# Patient Record
Sex: Female | Born: 1955 | Race: White | Hispanic: No | Marital: Married | Smoking: Never smoker
Health system: Southern US, Community
[De-identification: ages and names within clinical notes are randomized; demographics above are authoritative.]

## PROBLEM LIST (undated history)

## (undated) DIAGNOSIS — M810 Age-related osteoporosis without current pathological fracture: Secondary | ICD-10-CM

## (undated) DIAGNOSIS — F32A Depression, unspecified: Secondary | ICD-10-CM

## (undated) HISTORY — DX: Depression, unspecified: F32.A

## (undated) HISTORY — DX: Age-related osteoporosis without current pathological fracture: M81.0

---

## 2005-02-21 ENCOUNTER — Ambulatory Visit: Payer: Self-pay

## 2006-03-13 ENCOUNTER — Ambulatory Visit: Payer: Self-pay

## 2007-03-31 ENCOUNTER — Ambulatory Visit: Payer: Self-pay

## 2008-04-01 ENCOUNTER — Ambulatory Visit: Payer: Self-pay

## 2009-06-15 ENCOUNTER — Ambulatory Visit: Payer: Self-pay

## 2010-05-12 ENCOUNTER — Ambulatory Visit: Payer: Self-pay | Admitting: Gastroenterology

## 2010-05-12 HISTORY — PX: COLONOSCOPY: SHX174

## 2010-07-27 ENCOUNTER — Ambulatory Visit: Payer: Self-pay

## 2014-07-29 DIAGNOSIS — M818 Other osteoporosis without current pathological fracture: Secondary | ICD-10-CM | POA: Insufficient documentation

## 2015-04-25 DIAGNOSIS — Z85828 Personal history of other malignant neoplasm of skin: Secondary | ICD-10-CM

## 2015-04-25 HISTORY — DX: Personal history of other malignant neoplasm of skin: Z85.828

## 2016-05-31 DIAGNOSIS — Z8619 Personal history of other infectious and parasitic diseases: Secondary | ICD-10-CM | POA: Insufficient documentation

## 2016-08-16 ENCOUNTER — Other Ambulatory Visit: Payer: Self-pay | Admitting: Obstetrics and Gynecology

## 2016-08-16 DIAGNOSIS — Z1231 Encounter for screening mammogram for malignant neoplasm of breast: Secondary | ICD-10-CM

## 2016-09-13 ENCOUNTER — Encounter (HOSPITAL_COMMUNITY): Payer: Self-pay

## 2016-09-13 ENCOUNTER — Ambulatory Visit
Admission: RE | Admit: 2016-09-13 | Discharge: 2016-09-13 | Disposition: A | Discharge status: Home / Self Care | Payer: PRIVATE HEALTH INSURANCE | Source: Ambulatory Visit | Attending: Obstetrics and Gynecology | Admitting: Obstetrics and Gynecology

## 2016-09-13 DIAGNOSIS — Z1231 Encounter for screening mammogram for malignant neoplasm of breast: Secondary | ICD-10-CM | POA: Insufficient documentation

## 2016-09-20 ENCOUNTER — Other Ambulatory Visit: Payer: Self-pay | Admitting: *Deleted

## 2016-09-20 ENCOUNTER — Inpatient Hospital Stay
Admission: RE | Admit: 2016-09-20 | Discharge: 2016-09-20 | Disposition: A | Discharge status: Home / Self Care | Payer: Self-pay | Source: Ambulatory Visit | Attending: *Deleted | Admitting: *Deleted

## 2016-09-20 DIAGNOSIS — Z9289 Personal history of other medical treatment: Secondary | ICD-10-CM

## 2017-08-20 ENCOUNTER — Other Ambulatory Visit: Payer: Self-pay | Admitting: Obstetrics and Gynecology

## 2017-08-20 DIAGNOSIS — Z1231 Encounter for screening mammogram for malignant neoplasm of breast: Secondary | ICD-10-CM

## 2017-09-16 ENCOUNTER — Ambulatory Visit
Admission: RE | Admit: 2017-09-16 | Discharge: 2017-09-16 | Disposition: A | Discharge status: Home / Self Care | Payer: PRIVATE HEALTH INSURANCE | Source: Ambulatory Visit | Attending: Obstetrics and Gynecology | Admitting: Obstetrics and Gynecology

## 2017-09-16 DIAGNOSIS — Z1231 Encounter for screening mammogram for malignant neoplasm of breast: Secondary | ICD-10-CM | POA: Diagnosis not present

## 2017-11-06 ENCOUNTER — Ambulatory Visit
Admission: RE | Admit: 2017-11-06 | Discharge: 2017-11-06 | Disposition: A | Discharge status: Home / Self Care | Payer: PRIVATE HEALTH INSURANCE | Source: Ambulatory Visit | Attending: Internal Medicine | Admitting: Internal Medicine

## 2017-11-06 ENCOUNTER — Other Ambulatory Visit: Payer: Self-pay | Admitting: Internal Medicine

## 2017-11-06 DIAGNOSIS — M419 Scoliosis, unspecified: Secondary | ICD-10-CM | POA: Diagnosis not present

## 2017-11-06 DIAGNOSIS — K3580 Unspecified acute appendicitis: Secondary | ICD-10-CM | POA: Insufficient documentation

## 2017-11-06 MED ORDER — IOPAMIDOL (ISOVUE-370) INJECTION 76%
75.0000 mL | Freq: Once | INTRAVENOUS | Status: AC | PRN
  Administered 2017-11-06: 75 mL via INTRAVENOUS

## 2018-03-05 ENCOUNTER — Ambulatory Visit: Payer: Self-pay | Admitting: Podiatry

## 2018-11-24 ENCOUNTER — Other Ambulatory Visit: Payer: Self-pay

## 2018-11-24 ENCOUNTER — Ambulatory Visit: Payer: PRIVATE HEALTH INSURANCE | Attending: Obstetrics and Gynecology

## 2018-11-24 DIAGNOSIS — R293 Abnormal posture: Secondary | ICD-10-CM | POA: Insufficient documentation

## 2018-11-24 DIAGNOSIS — M62838 Other muscle spasm: Secondary | ICD-10-CM | POA: Diagnosis not present

## 2018-11-24 NOTE — Therapy (Deleted)
[place] MAIN [facility] SERVICES [address] [place], [place], [address] Phone: [phone]   Fax:  [phone]  Physical Therapy Evaluation  Patient Details  Name: Anna [name] MRN: [id number] Date of Birth: 08-24-1955 No data recorded  Encounter Date: 12/08/2018    Past Medical History:  Diagnosis Date  . Hx of squamous cell carcinoma of skin 04/25/2015   L chest parasternal    No past surgical history on file.  There were no vitals filed for this visit.    Pelvic Floor Physical Therapy Evaluation and Assessment  SCREENING  Falls in last 6 mo: no    Patient's communication preference:   Red Flags:  Have you had any night sweats? no Unexplained weight loss? No  Saddle anesthesia? no Unexplained changes in bowel or bladder habits? no  SUBJECTIVE  Patient reports: Pain increased ~ 2 years ago around the same time that she was diagnosed with lichen sclerosis. Has had a month where she did not have pain but had pain the most recent time. Wads going to the gym 3 times per week for personal training but was not able to due to covid-19 is still walking.  Precautions:  Hx. Of skin CA.  Social/Family/Vocational History:   Retired Pharmacist, hospital  Recent Procedures/Tests/Findings:  Lichen sclerosis  Obstetrical History: 2 vaginal with episiotomy (26 stitches)  Gynecological History: none  Urinary History: none  Gastrointestinal History: BM ~ daily with no straining in the last ~ 2 years  Sexual activity/pain: Yes both initial and with deep thrusting. Has "stinging" after  Location of pain: vaginal  Current pain:  0/10  Max pain:  6/10 Least pain:  0/10 Nature of pain: stinging, sharp  Patient Goals: To be able to have sex without being in pain.   OBJECTIVE  Posture/Observations:  Sitting: slouched Standing: L ASIS low, LLE ER, slight posterior pelvic tilt, L PSIS low. Decreased motion to L SB compared to R. L  rotation ~ 25% decreased with discomfort at ER. Supine: R ASIS high Prone: R PSIS high  Palpation/Segmental Motion/Joint Play: Decreased mobility through sacrum and T-L junction. R adductors and Psoas TTP  Special tests:   Supine to long-sit: R long in both Forward bend: apparent R lumbar curve (rotation of spine) Leg-length: 88.5 B  Range of Motion/Flexibilty:  Spine: Hips:   Strength/MMT:  LE MMT  LE MMT Left Right  Hip flex:  (L2) /5 /5  Hip ext: /5 /5  Hip abd: /5 /5  Hip add: /5 /5  Hip IR /5 /5  Hip ER /5 /5     Abdominal:  Palpation: R psoas TTP Diastasis: none  Pelvic Floor External Exam: Introitus Appears:  Skin integrity:  Palpation: Cough: Prolapse visible?: Scar mobility:  Internal Vaginal Exam: Strength (PERF):  Symmetry: Palpation: Prolapse:   Internal Rectal Exam: Strength (PERF): Symmetry: Palpation: Prolapse:   Gait Analysis:   Pelvic Floor Outcome Measures:  VQ: 4/33 (12%), Female NIH-CPSI: 13/43 (30%)  INTERVENTIONS THIS SESSION:   Total time:                Objective measurements completed on examination: See above findings.                             Patient will benefit from skilled therapeutic intervention in order to improve the following deficits and impairments:     Visit Diagnosis: No diagnosis found.     Problem  List There are no active problems to display for this patient.   Willa Rough 11/24/2018, 10:08 AM  [place] MAIN [facility] SERVICES [address] [place], [place], [address] Phone: [phone]   Fax:  [phone]  Name: Anna [name] MRN: [id number] Date of Birth: March 17, 1956

## 2018-11-24 NOTE — Therapy (Signed)
[place] MAIN [facility] SERVICES [address] [place], [place], [address] Phone: [phone]   Fax:  [phone]  Physical Therapy Evaluation  The patient has been informed of current processes in place at Outpatient Rehab to protect patients from Covid-19 exposure including social distancing, schedule modifications, and new cleaning procedures. After discussing their particular risk with a therapist based on the patient's personal risk factors, the patient has decided to proceed with in-person therapy.   Patient Details  Name: Anna [name] MRN: [id number] Date of Birth: 1955/09/25 Referring Provider (PT): Benjaman Kindler   Encounter Date: 11/24/2018    Past Medical History:  Diagnosis Date  . Hx of squamous cell carcinoma of skin 04/25/2015   L chest parasternal    History reviewed. No pertinent surgical history.  There were no vitals filed for this visit.       Pelvic Floor Physical Therapy Evaluation and Assessment  SCREENING  Falls in last 6 mo: no    Patient's communication preference:   Red Flags:  Have you had any night sweats? no Unexplained weight loss? No  Saddle anesthesia? no Unexplained changes in bowel or bladder habits? no  SUBJECTIVE  Patient reports: Pain increased ~ 2 years ago around the same time that she was diagnosed with lichen sclerosis. Has had a month where she did not have pain but had pain the most recent time. Wads going to the gym 3 times per week for personal training but was not able to due to covid-19 is still walking.  Precautions:  Hx. Of skin CA.  Social/Family/Vocational History:   Retired Pharmacist, hospital  Recent Procedures/Tests/Findings:  Lichen sclerosis  Obstetrical History: 2 vaginal with episiotomy (26 stitches)  Gynecological History: none  Urinary History: none  Gastrointestinal History: BM ~ daily with no straining in the last ~ 2 years  Sexual activity/pain: Yes  both initial and with deep thrusting. Has "stinging" after  Location of pain: vaginal  Current pain:  0/10  Max pain:  6/10 Least pain:  0/10 Nature of pain: stinging, sharp  Patient Goals: To be able to have sex without being in pain.   OBJECTIVE  Posture/Observations:  Sitting: slouched Standing: L ASIS low, LLE ER, slight posterior pelvic tilt, L PSIS low.  Supine: R ASIS high Prone: R PSIS high  Palpation/Segmental Motion/Joint Play: Decreased mobility through sacrum and T-L junction. R adductors and Psoas TTP  Special tests:   Supine to long-sit: R long in both Forward bend: apparent R lumbar curve (rotation of spine-R lumbar facets prominent) Leg-length: 88.5 B (may   Range of Motion/Flexibilty:  Spine: L SB 3 fingers past knee, R SB to knee. L rotation ~ 25% decreased with discomfort at ER.  Hips: deferred to follow-up   Strength/MMT: deferred to follow up LE MMT  LE MMT Left Right  Hip flex:  (L2) /5 /5  Hip ext: /5 /5  Hip abd: /5 /5  Hip add: /5 /5  Hip IR /5 /5  Hip ER /5 /5     Abdominal:  Palpation: R psoas TTP Diastasis: none  Pelvic Floor External Exam: Deferred due to covid-19 precautions Introitus Appears:  Skin integrity:  Palpation: Cough: Prolapse visible?: Scar mobility:  Internal Vaginal Exam: Strength (PERF):  Symmetry: Palpation: Prolapse:   Internal Rectal Exam: Strength (PERF): Symmetry: Palpation: Prolapse:   Gait Analysis: deferred to follow-up   Pelvic Floor Outcome Measures:  VQ: 4/33 (12%), Female NIH-CPSI: 13/43 (30%)  INTERVENTIONS THIS SESSION: Self-care:Educated on  the structure and function of the pelvic floor in relation to their symptoms as well as the POC, and initial HEP in order to set patient expectations and understanding from which we will build on in the future sessions.  Therex: Educated on and practiced bow-and-arrow and side-stretch to decrease impact of scoliosis by improving spinal  mobility and alignment to decrease pressure on nerve roots.  Total time: 60 min.  [facility] Building PT Assessment - 11/24/18 1338      Assessment   Medical Diagnosis  Vaginismus/dyspareunia    Referring Provider (PT)  Benjaman Kindler    Onset Date/Surgical Date  11/23/16    Prior Therapy  none      Precautions   Precautions  None      Restrictions   Weight Bearing Restrictions  No      Home Environment   Living Environment  Private residence    Living Arrangements  Spouse/significant other    Available Help at Discharge  Family    Type of [place] to enter    Entrance Stairs-Number of Steps  2    Entrance Stairs-Rails  None    Home Layout  One level      Prior Function   Level of [place]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                PT Short Term Goals - 11/24/18 1442      PT SHORT TERM GOAL #1   Title  Patient will demonstrate improved pelvic alignment and balance of musculature surrounding the pelvis to facilitate decreased PFM spasms and decrease pelvic pain.    Baseline  Pt. Demos spasms in R adductor and Psoas, R innominate up-slipped     Time  5    Period  Weeks    Status  New    Target Date  12/29/18      PT SHORT TERM GOAL #2   Title  Patient will demonstrate HEP x1 in the clinic to demonstrate understanding and proper form to allow for further improvement.    Baseline  Pt. lacks knowledge of therapeutic exercises that will decrease her pain.    Time  5    Period  Weeks    Status  New    Target Date  12/29/18      PT SHORT TERM GOAL #3   Title  Patient will report a reduction in pain to no greater than 3/10 over the prior week to  demonstrate symptom improvement.    Baseline  6/10 with intercourse    Time  5    Period  Weeks    Status  New    Target Date  12/29/18        PT Long Term Goals - 11/24/18 1502      PT LONG TERM GOAL #1   Title  Patient will report no pain with intercourse to demonstrate improved functional ability.    Baseline  Pt. is in 6/10 pain with intercourse.    Time  10    Period  Weeks  Status  New    Target Date  02/02/19      PT LONG TERM GOAL #2   Title  Patient will score less than or equal to 15% on the Female NIH-CPSI and 6% on the VQ to demonstrate a reduction in pain, urinary symptoms, and an improved quality of life.    Baseline  VQ: 4/33 (12%), Female NIH-CPSI: 13/43 (30%)    Time  10    Period  Weeks    Target Date  02/02/19      PT LONG TERM GOAL #3   Title  Patient will demonstrate appropriate body mechanics with bending and lifting heavy objects to allow for decreased stress on the pelvic floor and low back.     Baseline  Pt demos genu valgus with BW squat, contributing to adductor spasms and PFM spasms.    Time  10    Period  Weeks    Status  New    Target Date  02/02/19             Plan - 11/24/18 1345    Clinical Impression Statement  Pt. is a 63 y/o female who presents today with cheif c/o pain with intercourse starting ~ 2 years prior. Her relevant PMH includes lichen sclerosis and 2 vaginal deliveries with significant episiotomies. Her clinical assessment revealed mild scoliosis and a true vs. apparent leg-length difference as well as significant spasm in the R adductors and psoas which are likely contributing, along with the lichen sclerosis, to vaginismus and pain with intercourse. She will benefit from skilled pelvic PT to address the noted defecits and to continue to assess for and address other potential causes of pain.    Personal Factors and Comorbidities  Comorbidity 1    Comorbidities  Lichen Sclerosis    Examination-Activity Limitations  Other    intercourse   Examination-Participation Restrictions  Interpersonal Relationship    Stability/Clinical Decision Making  Evolving/Moderate complexity    Clinical Decision Making  Moderate    Rehab Potential  Good    PT Frequency  1x / week    PT Duration  --   10 weeks   PT Treatment/Interventions  ADLs/Self Care Home Management;Biofeedback;Electrical Stimulation;Traction;Moist Heat;Functional mobility training;Neuromuscular re-education;Therapeutic exercise;Therapeutic activities;Patient/family education;Manual techniques;Dry needling;Passive range of motion;Scar mobilization;Spinal Manipulations;Joint Manipulations    PT Next Visit Plan  TP release vs. DN to R adductors and Psoas, pelvic allignment, re-assess leg-length following alignment, posture education.    PT Home Exercise Plan  bow-and-arrow, side-stretch.    Consulted and Agree with Plan of Care  Patient       Patient will benefit from skilled therapeutic intervention in order to improve the following deficits and impairments:  Pain, Postural dysfunction, Increased muscle spasms, Decreased scar mobility, Decreased mobility, Decreased coordination, Decreased range of motion  Visit Diagnosis: Other muscle spasm  Abnormal posture     Problem List There are no active problems to display for this patient.  Willa Rough DPT, ATC Willa Rough 11/24/2018, 3:06 PM  [place] MAIN [facility] SERVICES [address] [place], [place], [address] Phone: [phone]   Fax:  [phone]  Name: AARILYN DYE MRN: [id number] Date of Birth: May 22, 1956

## 2018-11-24 NOTE — Patient Instructions (Signed)
   Hold for 30 seconds (5 deep breaths) and repeat 2-3 times on each side once a day

## 2018-11-24 NOTE — Therapy (Signed)
[place] MAIN [facility] SERVICES [address] [place], [place], [address] Phone: [phone]   Fax:  [phone]  Patient Details  Name: Anna [name] MRN: [id number] Date of Birth: [birth year] Referring Provider:  Rusty Aus, MD  Encounter Date: 12/08/2018  This encounter was an erroneous error for a future DOS.  Willa Rough DPT, ATC Willa Rough 11/24/2018, 1:41 PM  [place] MAIN [facility] SERVICES [address] [place], [place], [address] Phone: [phone]   Fax:  [phone]

## 2018-12-02 ENCOUNTER — Ambulatory Visit: Payer: PRIVATE HEALTH INSURANCE

## 2018-12-02 ENCOUNTER — Other Ambulatory Visit: Payer: Self-pay

## 2018-12-02 ENCOUNTER — Other Ambulatory Visit: Payer: Self-pay | Admitting: Obstetrics and Gynecology

## 2018-12-02 DIAGNOSIS — R293 Abnormal posture: Secondary | ICD-10-CM

## 2018-12-02 DIAGNOSIS — M62838 Other muscle spasm: Secondary | ICD-10-CM

## 2018-12-02 DIAGNOSIS — Z1231 Encounter for screening mammogram for malignant neoplasm of breast: Secondary | ICD-10-CM

## 2018-12-02 NOTE — Patient Instructions (Signed)
     Find a tender spot and hold pressure with hand, ball, theracane etc. And take deep diaphragmatic breaths until the pain is reduced by at least 50%.  Stabilization: Diaphragmatic Breathing    Lie with knees bent, feet flat. Place one hand on stomach, other on chest. Breathe deeply through nose, lifting belly hand without any motion of hand on chest.

## 2018-12-02 NOTE — Therapy (Signed)
[place] MAIN [facility] SERVICES [address] [place], [place], [address] Phone: [phone]   Fax:  [phone]  Physical Therapy Treatment  The patient has been informed of current processes in place at Outpatient Rehab to protect patients from Covid-19 exposure including social distancing, schedule modifications, and new cleaning procedures. After discussing their particular risk with a therapist based on the patient's personal risk factors, the patient has decided to proceed with in-person therapy.   Patient Details  Name: Anna [name] MRN: [id number] Date of Birth: May 03, 1956 Referring Provider (PT): Benjaman Kindler   Encounter Date: 12/02/2018  PT End of Session - 12/04/18 1014    Visit Number  2    Number of Visits  10    Date for PT Re-Evaluation  02/02/19    Authorization Type  medcost    Authorization Time Period  through 02/02/2019    Authorization - Visit Number  2    Authorization - Number of Visits  10    PT Start Time  1000    PT Stop Time  1100    PT Time Calculation (min)  60 min    Activity Tolerance  Patient tolerated treatment well;No increased pain    Behavior During Therapy  WFL for tasks assessed/performed       Past Medical History:  Diagnosis Date  . Hx of squamous cell carcinoma of skin 04/25/2015   L chest parasternal    No past surgical history on file.  There were no vitals filed for this visit.    Pelvic Floor Physical Therapy Treatment Note  SCREENING  Changes in medications, allergies, or medical history?: none    SUBJECTIVE  Patient reports:  Feels good about the exercises, but her R LE seems to be worse, pain into R foot.  Precautions:  Hx. Of skin CA.  Pain update:  Location of pain:  Current pain:  3/10  Max pain:  4/10 Least pain:  2/10 Nature of pain: stinging, sharp  * just "sore" from pressure not normal pain following treatment.  Patient Goals: To be able to have sex  without being in pain.   OBJECTIVE  Changes in: Posture/Observations:  RLE appears long and up-slipped  Range of Motion/Flexibilty:  Improved thoracic rotation compared to evaluation due to use of HEP.  Palpation: TTP to R adductors, iliacus, and QL  INTERVENTIONS THIS SESSION: Manual: Performed TP release and STM to R adductors, iliacus, and QL to decrease spasms and allow for decreased pain and improved pelvic alignment.  Theract: Educated on how to perform self TP release at home with tennis ball or theracane to continue to decrease spasms and pain due to Pt. Slow response. Educated on and practiced diaphragmatic breathing to allow for successful TP release and to improve coordination of deep core musculature to allow for decreased PFM spasm and pain.  Total time: 60 min.                   Trigger Point Dry Needling - 12/04/18 0001    Consent Given?  --             PT Short Term Goals - 11/24/18 1442      PT SHORT TERM GOAL #1   Title  Patient will demonstrate improved pelvic alignment and balance of musculature surrounding the pelvis to facilitate decreased PFM spasms and decrease pelvic pain.    Baseline  Pt. Demos spasms in R adductor and Psoas, R innominate up-slipped  Time  5    Period  Weeks    Status  New    Target Date  12/29/18      PT SHORT TERM GOAL #2   Title  Patient will demonstrate HEP x1 in the clinic to demonstrate understanding and proper form to allow for further improvement.    Baseline  Pt. lacks knowledge of therapeutic exercises that will decrease her pain.    Time  5    Period  Weeks    Status  New    Target Date  12/29/18      PT SHORT TERM GOAL #3   Title  Patient will report a reduction in pain to no greater than 3/10 over the prior week to demonstrate symptom improvement.    Baseline  6/10 with intercourse    Time  5    Period  Weeks    Status  New    Target Date  12/29/18        PT Long Term Goals -  11/24/18 1502      PT LONG TERM GOAL #1   Title  Patient will report no pain with intercourse to demonstrate improved functional ability.    Baseline  Pt. is in 6/10 pain with intercourse.    Time  10    Period  Weeks    Status  New    Target Date  02/02/19      PT LONG TERM GOAL #2   Title  Patient will score less than or equal to 15% on the Female NIH-CPSI and 6% on the VQ to demonstrate a reduction in pain, urinary symptoms, and an improved quality of life.    Baseline  VQ: 4/33 (12%), Female NIH-CPSI: 13/43 (30%)    Time  10    Period  Weeks    Target Date  02/02/19      PT LONG TERM GOAL #3   Title  Patient will demonstrate appropriate body mechanics with bending and lifting heavy objects to allow for decreased stress on the pelvic floor and low back.     Baseline  Pt demos genu valgus with BW squat, contributing to adductor spasms and PFM spasms.    Time  10    Period  Weeks    Status  New    Target Date  02/02/19            Plan - 12/04/18 1016    Clinical Impression Statement  Pt. responded well to all interventions today though she responds slowly to TP release and will benefit from DN in the future. She demonstrated decreased tensing ond painas well as understanding and correct performance of all eduction and exercises provided. Heel-lift to be given at following appointment due to Pt's continued stiffness increasing likelihood of increased pain with heel-lift if given at this visit. Continue per POC.    Personal Factors and Comorbidities  Comorbidity 1    Comorbidities  Lichen Sclerosis    Examination-Activity Limitations  Other   intercourse   Examination-Participation Restrictions  Interpersonal Relationship    Stability/Clinical Decision Making  Evolving/Moderate complexity    Rehab Potential  Good    PT Frequency  1x / week    PT Duration  --   10 weeks   PT Treatment/Interventions  ADLs/Self Care Home Management;Biofeedback;Electrical  Stimulation;Traction;Moist Heat;Functional mobility training;Neuromuscular re-education;Therapeutic exercise;Therapeutic activities;Patient/family education;Manual techniques;Dry needling;Passive range of motion;Scar mobilization;Spinal Manipulations;Joint Manipulations    PT Next Visit Plan  DN to R Psoas, LB PRN  re-assess pelvic allignment, re-assess leg-length following alignment, posture education.    PT Home Exercise Plan  bow-and-arrow, side-stretch, self TP release and diaphragmatic breathing.    Consulted and Agree with Plan of Care  Patient       Patient will benefit from skilled therapeutic intervention in order to improve the following deficits and impairments:  Pain, Postural dysfunction, Increased muscle spasms, Decreased scar mobility, Decreased mobility, Decreased coordination, Decreased range of motion  Visit Diagnosis: Other muscle spasm -   Abnormal posture -      Problem List There are no active problems to display for this patient.  Willa Rough DPT, ATC Willa Rough 12/04/2018, 10:21 AM  [place] MAIN [facility] SERVICES [address] [place], [place], [address] Phone: [phone]   Fax:  [phone]  Name: Anna [name] MRN: [id number] Date of Birth: July 15, 1955

## 2018-12-08 ENCOUNTER — Ambulatory Visit: Payer: PRIVATE HEALTH INSURANCE

## 2018-12-15 ENCOUNTER — Other Ambulatory Visit: Payer: Self-pay

## 2018-12-15 ENCOUNTER — Ambulatory Visit: Payer: PRIVATE HEALTH INSURANCE

## 2018-12-15 DIAGNOSIS — M62838 Other muscle spasm: Secondary | ICD-10-CM | POA: Diagnosis not present

## 2018-12-15 DIAGNOSIS — R293 Abnormal posture: Secondary | ICD-10-CM

## 2018-12-15 NOTE — Patient Instructions (Signed)
    Sit up with a tall spine and cross one leg over the other knee. Hinge from the hip and lean until you can feel a stretch through your bottom hold for 5 deep breaths and then switch sides. Repeat 2-3 times on each side.

## 2018-12-15 NOTE — Therapy (Signed)
[place] MAIN [facility] SERVICES [address] [place], [place], [address] Phone: [phone]   Fax:  [phone]  Physical Therapy Treatment  The patient has been informed of current processes in place at Outpatient Rehab to protect patients from Covid-19 exposure including social distancing, schedule modifications, and new cleaning procedures. After discussing their particular risk with a therapist based on the patient's personal risk factors, the patient has decided to proceed with in-person therapy.   Patient Details  Name: Anna [name] MRN: [id number] Date of Birth: 1956-03-09 Referring Provider (PT): Benjaman Kindler   Encounter Date: 12/15/2018  PT End of Session - 12/15/18 1105    Visit Number  3    Number of Visits  10    Date for PT Re-Evaluation  02/02/19    Authorization Type  medcost    Authorization Time Period  through 02/02/2019    Authorization - Visit Number  3    Authorization - Number of Visits  10    PT Start Time  0950    PT Stop Time  2725    PT Time Calculation (min)  61 min    Activity Tolerance  Patient tolerated treatment well;No increased pain    Behavior During Therapy  WFL for tasks assessed/performed       Past Medical History:  Diagnosis Date  . Hx of squamous cell carcinoma of skin 04/25/2015   L chest parasternal    No past surgical history on file.  There were no vitals filed for this visit.    Pelvic Floor Physical Therapy Treatment Note  SCREENING  Changes in medications, allergies, or medical history?: no    SUBJECTIVE  Patient reports: Has tried having intercourse which did not go well.  Precautions:  Hx. Of skin CA.  Pain update:  Location of pain: R outer thigh Current pain:  2/10  Max pain:  4/10 Least pain:  0/10 Nature of pain: dull achy  *Pt. Reports "feels much better" but has 4/10 pain which is sore like DOMS, not like normal pain.  Patient Goals: To be able to  have sex without being in pain.   OBJECTIVE  Changes in:  Palpation: TTP to R QL, Lumbar extensors, Piriformis, and OI.    INTERVENTIONS THIS SESSION: Self-care: Educated further on dilator use and options as well as how to use HSA to help cover cost PRN.  Manual: Performed TP release and STM to R QL, Piriformis, and OI to decrease pain and pressure on lumbosacral nerve roots to allow PFM relaxation. Dry-needle: Performed TPDN  As described above to decrease spasm and pain and allow for improved balance of musculature for improved function and decreased symptoms. Therex: Educated on and practiced Piriformis stretch to decrease tension on Sciatic nerve and pain in buttock.   Total time: 61 min.                    Trigger Point Dry Needling - 12/15/18 0001    Consent Given?  Yes    Education Handout Provided  No    Muscles Treated Back/Hip  Erector spinae;Obturator internus;Quadratus lumborum    Dry Needling Comments  --   R side, used standard procedure in side-lying for all areas   Erector spinae Response  Twitch response elicited;Palpable increased muscle length    Obturator internus Response  Twitch response elicited;Palpable increased muscle length    Quadratus Lumborum Response  Twitch response elicited;Palpable increased muscle length  PT Short Term Goals - 11/24/18 1442      PT SHORT TERM GOAL #1   Title  Patient will demonstrate improved pelvic alignment and balance of musculature surrounding the pelvis to facilitate decreased PFM spasms and decrease pelvic pain.    Baseline  Pt. Demos spasms in R adductor and Psoas, R innominate up-slipped     Time  5    Period  Weeks    Status  New    Target Date  12/29/18      PT SHORT TERM GOAL #2   Title  Patient will demonstrate HEP x1 in the clinic to demonstrate understanding and proper form to allow for further improvement.    Baseline  Pt. lacks knowledge of therapeutic exercises that  will decrease her pain.    Time  5    Period  Weeks    Status  New    Target Date  12/29/18      PT SHORT TERM GOAL #3   Title  Patient will report a reduction in pain to no greater than 3/10 over the prior week to demonstrate symptom improvement.    Baseline  6/10 with intercourse    Time  5    Period  Weeks    Status  New    Target Date  12/29/18        PT Long Term Goals - 11/24/18 1502      PT LONG TERM GOAL #1   Title  Patient will report no pain with intercourse to demonstrate improved functional ability.    Baseline  Pt. is in 6/10 pain with intercourse.    Time  10    Period  Weeks    Status  New    Target Date  02/02/19      PT LONG TERM GOAL #2   Title  Patient will score less than or equal to 15% on the Female NIH-CPSI and 6% on the VQ to demonstrate a reduction in pain, urinary symptoms, and an improved quality of life.    Baseline  VQ: 4/33 (12%), Female NIH-CPSI: 13/43 (30%)    Time  10    Period  Weeks    Target Date  02/02/19      PT LONG TERM GOAL #3   Title  Patient will demonstrate appropriate body mechanics with bending and lifting heavy objects to allow for decreased stress on the pelvic floor and low back.     Baseline  Pt demos genu valgus with BW squat, contributing to adductor spasms and PFM spasms.    Time  10    Period  Weeks    Status  New    Target Date  02/02/19            Plan - 12/15/18 1106    Clinical Impression Statement  Pt. responded well to all interventions today, demonstrating improved response to combination of manual treatment and TPDN than to manual alone. Pt. continues to have Pirifomis spasm on R that will need to be treated at following session. Pt. demonstrated understanding and correct performance of all education provided. Continue per POC.    Personal Factors and Comorbidities  Comorbidity 1    Comorbidities  Lichen Sclerosis    Examination-Activity Limitations  Other   intercourse   Examination-Participation  Restrictions  Interpersonal Relationship    Stability/Clinical Decision Making  Evolving/Moderate complexity    Rehab Potential  Good    PT Frequency  1x / week  PT Duration  --   10 weeks   PT Treatment/Interventions  ADLs/Self Care Home Management;Biofeedback;Electrical Stimulation;Traction;Moist Heat;Functional mobility training;Neuromuscular re-education;Therapeutic exercise;Therapeutic activities;Patient/family education;Manual techniques;Dry needling;Passive range of motion;Scar mobilization;Spinal Manipulations;Joint Manipulations    PT Next Visit Plan  DN to R Psoas, Piriformis PRN re-assess pelvic allignment, re-assess leg-length following alignment, posture education.    PT Home Exercise Plan  bow-and-arrow, side-stretch, self TP release and diaphragmatic breathing, piriformis stretch.    Consulted and Agree with Plan of Care  Patient       Patient will benefit from skilled therapeutic intervention in order to improve the following deficits and impairments:  Pain, Postural dysfunction, Increased muscle spasms, Decreased scar mobility, Decreased mobility, Decreased coordination, Decreased range of motion  Visit Diagnosis: 1. Other muscle spasm   2. Abnormal posture        Problem List There are no active problems to display for this patient.  Willa Rough DPT, ATC Willa Rough 12/15/2018, 11:15 AM  [place] MAIN [facility] SERVICES [address] [place], [place], [address] Phone: [phone]   Fax:  [phone]  Name: Anna [name] MRN: [id number] Date of Birth: 1956/05/22

## 2018-12-16 ENCOUNTER — Other Ambulatory Visit: Payer: Self-pay

## 2018-12-16 ENCOUNTER — Ambulatory Visit
Admission: RE | Admit: 2018-12-16 | Discharge: 2018-12-16 | Disposition: A | Discharge status: Home / Self Care | Payer: PRIVATE HEALTH INSURANCE | Source: Ambulatory Visit | Attending: Obstetrics and Gynecology | Admitting: Obstetrics and Gynecology

## 2018-12-16 DIAGNOSIS — Z1231 Encounter for screening mammogram for malignant neoplasm of breast: Secondary | ICD-10-CM | POA: Insufficient documentation

## 2018-12-22 ENCOUNTER — Ambulatory Visit: Payer: PRIVATE HEALTH INSURANCE

## 2018-12-22 ENCOUNTER — Other Ambulatory Visit: Payer: Self-pay

## 2018-12-22 DIAGNOSIS — R293 Abnormal posture: Secondary | ICD-10-CM

## 2018-12-22 DIAGNOSIS — M62838 Other muscle spasm: Secondary | ICD-10-CM | POA: Diagnosis not present

## 2018-12-22 NOTE — Patient Instructions (Signed)
Use dilator with lubricant for ~ 10 min. Every-other day. Move up a size when able to insert fully with very little discomfort. Start with one-size down from your "working size"   Use your deep breathing and imagery of relaxation/flower blooming   Lubrication . Used for intercourse to reduce friction . Avoid ones that have glycerin, warming gels, tingling gels, icing or cooling gel, scented . May need to be reapplied once or several times during sexual activity . Can be applied to both partners genitals prior to vaginal penetration to minimize friction or irritation . Prevent irritation and mucosal tears that cause post coital pain and increased the risk of vaginal and urinary tract infections . Oil-based lubricants cannot be used with condoms due to breaking them down.  Least likely to irritate vaginal tissue.  . Plant based-lubes are safe . Silicone-based lubrication are thicker and last long and used for post-menopausal women Types of Lubricants . Good Clean Love (water based)-[employer], [employer], [employer], [employer] . Slippery Stuff(water based) [employer] . Sylk (water based) [employer], Cricket- drug store; www.blossom-organics.com . Samul Dada- Drug store . Coconut oil- will breakdown condoms, least irritating . Aloe Vera- least irritating . Sliquid Natural H20 (water based)-[employer], good if frequent UTI's . Wet Platinum- (Silicone) Target, [employer] . Yes [place] . [place] Jelly, Replens, and Astroglide kills good Bacteria (lactobadilli)  Things to avoid in the vaginal area . Do not use things to irritate the vulvar area . No lotions . No soaps; can use Aveeno or Calendula cleanser if needed. Must be gentle . No deodorants . No douches . Good to sleep without underwear to let the vaginal area to air out . No scrubbing: spread the lips to let warm water rinse over labias and pat dry

## 2018-12-22 NOTE — Therapy (Signed)
[place] MAIN [facility] SERVICES [address] [place], [place], [address] Phone: [phone]   Fax:  [phone]  Physical Therapy Treatment  The patient has been informed of current processes in place at Outpatient Rehab to protect patients from Covid-19 exposure including social distancing, schedule modifications, and new cleaning procedures. After discussing their particular risk with a therapist based on the patient's personal risk factors, the patient has decided to proceed with in-person therapy.   Patient Details  Name: KLARA STJAMES MRN: [id number] Date of Birth: 1956-06-05 Referring Provider (PT): Benjaman Kindler   Encounter Date: 12/22/2018  PT End of Session - 12/22/18 1642    Visit Number  4    Number of Visits  10    Date for PT Re-Evaluation  02/02/19    Authorization Type  medcost    Authorization Time Period  through 02/02/2019    Authorization - Visit Number  4    Authorization - Number of Visits  10    PT Start Time  1761    PT Stop Time  1122    PT Time Calculation (min)  60 min    Activity Tolerance  Patient tolerated treatment well;No increased pain    Behavior During Therapy  WFL for tasks assessed/performed       Past Medical History:  Diagnosis Date  . Hx of squamous cell carcinoma of skin 04/25/2015   L chest parasternal    No past surgical history on file.  There were no vitals filed for this visit.   Pelvic Floor Physical Therapy Treatment Note  SCREENING  Changes in medications, allergies, or medical history?: none    SUBJECTIVE  Patient reports: Slipped and fell in her garage on some water on the floor, her LB is hurting a little and R inner knee. Ordered her dilators (white set)  Precautions:  Hx. Of skin CA.  Pain update:  Location of pain: R knee (bruised) and R SIJ/LB Current pain:  4/10  Max pain:  4/10 Least pain:  0/10 Nature of pain: dull achy  Patient Goals: To be able to  have sex without being in pain.   OBJECTIVE  Changes in: Posture/Observations:  R [place] elevated following fall on R knee  Palpation: TTP to R QL and Lumbar Multifidus and paraspinals   INTERVENTIONS THIS SESSION: Manual: Performed TP release to R QL and Lumbar Multifidus and paraspinals to decrease pain and spasm and allow for return to normal alignment following fall to allow for further improvement of balance and allow for PFM relaxation. Dry-needle: Performed TPDN as described below using .30x60 and .30x14mm needles and standard approach To maintain and improve muscle length and allow for improved balance of musculature for long-term symptom relief. Self-care: educated on how to use dilators and vaginal lubricants to increase tissue extensibility and decrease pain with intercourse.  Total time: 60 min.    [facility] PT Assessment - 12/22/18 0001      Balance Screen   Has the patient fallen in the past 6 months  Yes   slipped on water in her garage, landed on R knee.                    Trigger Point Dry Needling - 12/22/18 0001    Consent Given?  Yes    Education Handout Provided  No    Muscles Treated Back/Hip  Erector spinae;Lumbar multifidi;Quadratus lumborum    Dry Needling Comments  Right side  Erector spinae Response  Twitch response elicited;Palpable increased muscle length    Lumbar multifidi Response  Twitch response elicited;Palpable increased muscle length    Quadratus Lumborum Response  Twitch response elicited;Palpable increased muscle length             PT Short Term Goals - 11/24/18 1442      PT SHORT TERM GOAL #1   Title  Patient will demonstrate improved pelvic alignment and balance of musculature surrounding the pelvis to facilitate decreased PFM spasms and decrease pelvic pain.    Baseline  Pt. Demos spasms in R adductor and Psoas, R innominate up-slipped     Time  5    Period  Weeks    Status  New    Target Date  12/29/18       PT SHORT TERM GOAL #2   Title  Patient will demonstrate HEP x1 in the clinic to demonstrate understanding and proper form to allow for further improvement.    Baseline  Pt. lacks knowledge of therapeutic exercises that will decrease her pain.    Time  5    Period  Weeks    Status  New    Target Date  12/29/18      PT SHORT TERM GOAL #3   Title  Patient will report a reduction in pain to no greater than 3/10 over the prior week to demonstrate symptom improvement.    Baseline  6/10 with intercourse    Time  5    Period  Weeks    Status  New    Target Date  12/29/18        PT Long Term Goals - 11/24/18 1502      PT LONG TERM GOAL #1   Title  Patient will report no pain with intercourse to demonstrate improved functional ability.    Baseline  Pt. is in 6/10 pain with intercourse.    Time  10    Period  Weeks    Status  New    Target Date  02/02/19      PT LONG TERM GOAL #2   Title  Patient will score less than or equal to 15% on the Female NIH-CPSI and 6% on the VQ to demonstrate a reduction in pain, urinary symptoms, and an improved quality of life.    Baseline  VQ: 4/33 (12%), Female NIH-CPSI: 13/43 (30%)    Time  10    Period  Weeks    Target Date  02/02/19      PT LONG TERM GOAL #3   Title  Patient will demonstrate appropriate body mechanics with bending and lifting heavy objects to allow for decreased stress on the pelvic floor and low back.     Baseline  Pt demos genu valgus with BW squat, contributing to adductor spasms and PFM spasms.    Time  10    Period  Weeks    Status  New    Target Date  02/02/19            Plan - 12/22/18 1642    Clinical Impression Statement  Pt. Pt. Responded well to all interventions today, demonstrating improved pelvic alignment, decreased spasm and pain, as well as understanding and correct performance of all education and exercises provided today. They will continue to benefit from skilled physical therapy to work toward remaining  goals and maximize function as well as decrease likelihood of symptom increase or recurrence.    Personal Factors and Comorbidities  Comorbidity 1    Comorbidities  Lichen Sclerosis    Examination-Activity Limitations  Other   intercourse   Examination-Participation Restrictions  Interpersonal Relationship    Stability/Clinical Decision Making  Evolving/Moderate complexity    Rehab Potential  Good    PT Frequency  1x / week    PT Duration  --   10 weeks   PT Treatment/Interventions  ADLs/Self Care Home Management;Biofeedback;Electrical Stimulation;Traction;Moist Heat;Functional mobility training;Neuromuscular re-education;Therapeutic exercise;Therapeutic activities;Patient/family education;Manual techniques;Dry needling;Passive range of motion;Scar mobilization;Spinal Manipulations;Joint Manipulations    PT Next Visit Plan  DN to R Psoas, Piriformis PRN re-assess pelvic allignment, re-assess leg-length following alignment, posture education.    PT Home Exercise Plan  bow-and-arrow, side-stretch, self TP release and diaphragmatic breathing, piriformis stretch, dilator use, vaginal lubricants.    Consulted and Agree with Plan of Care  Patient       Patient will benefit from skilled therapeutic intervention in order to improve the following deficits and impairments:  Pain, Postural dysfunction, Increased muscle spasms, Decreased scar mobility, Decreased mobility, Decreased coordination, Decreased range of motion  Visit Diagnosis: 1. Other muscle spasm   2. Abnormal posture        Problem List There are no active problems to display for this patient.  Willa Rough DPT, ATC Willa Rough 12/22/2018, 4:51 PM  [place] MAIN [facility] SERVICES [address] [place], [place], [address] Phone: [phone]   Fax:  [phone]  Name: LOVELLE DEITRICK MRN: [id number] Date of Birth: [birth year]

## 2018-12-29 ENCOUNTER — Ambulatory Visit: Payer: PRIVATE HEALTH INSURANCE | Attending: Obstetrics and Gynecology

## 2018-12-29 ENCOUNTER — Other Ambulatory Visit: Payer: Self-pay

## 2018-12-29 DIAGNOSIS — R293 Abnormal posture: Secondary | ICD-10-CM

## 2018-12-29 DIAGNOSIS — M62838 Other muscle spasm: Secondary | ICD-10-CM

## 2018-12-29 NOTE — Therapy (Addendum)
[place] MAIN [facility] SERVICES [address] [place], [place], [address] Phone: [phone]   Fax:  [phone]  Physical Therapy Treatment  The patient has been informed of current processes in place at Outpatient Rehab to protect patients from Covid-19 exposure including social distancing, schedule modifications, and new cleaning procedures. After discussing their particular risk with a therapist based on the patient's personal risk factors, the patient has decided to proceed with in-person therapy.   Patient Details  Name: Anna [name] MRN: [id number] Date of Birth: 10-Dec-1955 Referring Provider (PT): Benjaman Kindler   Encounter Date: 12/29/2018  PT End of Session - 12/29/18 1608    Visit Number  5    Number of Visits  10    Date for PT Re-Evaluation  02/02/19    Authorization Type  medcost    Authorization Time Period  through 02/02/2019    Authorization - Visit Number  5    Authorization - Number of Visits  10    PT Start Time  1000    PT Stop Time  1057    PT Time Calculation (min)  57 min    Activity Tolerance  Patient tolerated treatment well;No increased pain    Behavior During Therapy  WFL for tasks assessed/performed       Past Medical History:  Diagnosis Date  . Hx of squamous cell carcinoma of skin 04/25/2015   L chest parasternal    No past surgical history on file.  There were no vitals filed for this visit.    Pelvic Floor Physical Therapy Treatment Note  SCREENING  Changes in medications, allergies, or medical history?: none    SUBJECTIVE  Patient reports: [place] on a girls trip and has not tried her dilators yet.  Precautions:  Hx. Of skin CA.  Pain update:  Location of pain: R buttock Current pain:  2/10  Max pain:  2/10 Least pain:  0/10 Nature of pain: achy  Patient Goals: To be able to have sex without being in pain.   OBJECTIVE  Changes in: Posture/Observations:  Leg-length:  measured equal after MET correction, slight up-slip on L was remaining due to QL spasm at end of session.   Range of Motion/Flexibilty:  Decreased mobility to R>L sacral border with mild tenderness to pressure.   Palpation: TTP to B OI, Piriformis, Psoas and L QL.  INTERVENTIONS THIS SESSION: Manual: TP release to B OI, Piriformis, Psoas and educated on self TP release to L QL to decrease spasm and pain and allow for improved balance of musculature for improved function and decreased symptoms. Performed MET correction  For L anterior rotation and PA mobs to all sacral borders. Posteriorly directed rotational mobs to L innominate to improve mobility of joint and surrounding connective tissue and decrease pressure on nerve roots for improved conductivity and function of down-stream tissues.   Self-care: educated on using coconut oil/vit E oil suppositories to improve tissue health and allow for dilation more easily with less pain.  Total time: 57 min.                            PT Short Term Goals - 11/24/18 1442      PT SHORT TERM GOAL #1   Title  Patient will demonstrate improved pelvic alignment and balance of musculature surrounding the pelvis to facilitate decreased PFM spasms and decrease pelvic pain.    Baseline  Pt. Demos spasms  in R adductor and Psoas, R innominate up-slipped     Time  5    Period  Weeks    Status  New    Target Date  12/29/18      PT SHORT TERM GOAL #2   Title  Patient will demonstrate HEP x1 in the clinic to demonstrate understanding and proper form to allow for further improvement.    Baseline  Pt. lacks knowledge of therapeutic exercises that will decrease her pain.    Time  5    Period  Weeks    Status  New    Target Date  12/29/18      PT SHORT TERM GOAL #3   Title  Patient will report a reduction in pain to no greater than 3/10 over the prior week to demonstrate symptom improvement.    Baseline  6/10 with intercourse    Time   5    Period  Weeks    Status  New    Target Date  12/29/18        PT Long Term Goals - 11/24/18 1502      PT LONG TERM GOAL #1   Title  Patient will report no pain with intercourse to demonstrate improved functional ability.    Baseline  Pt. is in 6/10 pain with intercourse.    Time  10    Period  Weeks    Status  New    Target Date  02/02/19      PT LONG TERM GOAL #2   Title  Patient will score less than or equal to 15% on the Female NIH-CPSI and 6% on the VQ to demonstrate a reduction in pain, urinary symptoms, and an improved quality of life.    Baseline  VQ: 4/33 (12%), Female NIH-CPSI: 13/43 (30%)    Time  10    Period  Weeks    Target Date  02/02/19      PT LONG TERM GOAL #3   Title  Patient will demonstrate appropriate body mechanics with bending and lifting heavy objects to allow for decreased stress on the pelvic floor and low back.     Baseline  Pt demos genu valgus with BW squat, contributing to adductor spasms and PFM spasms.    Time  10    Period  Weeks    Status  New    Target Date  02/02/19            Plan - 12/29/18 1609    Clinical Impression Statement  Pt. Pt. Responded well to all interventions today, demonstrating improved pelvic alignment and decreased spasms and pain from 2/10 to 0/10, as well as understanding and correct performance of all education and exercises provided today. They will continue to benefit from skilled physical therapy to work toward remaining goals and maximize function as well as decrease likelihood of symptom increase or recurrence.    Personal Factors and Comorbidities  Comorbidity 1    Comorbidities  Lichen Sclerosis    Examination-Activity Limitations  Other   intercourse   Examination-Participation Restrictions  Interpersonal Relationship    Stability/Clinical Decision Making  Evolving/Moderate complexity    Rehab Potential  Good    PT Frequency  1x / week    PT Duration  --   10 weeks   PT Treatment/Interventions   ADLs/Self Care Home Management;Biofeedback;Electrical Stimulation;Traction;Moist Heat;Functional mobility training;Neuromuscular re-education;Therapeutic exercise;Therapeutic activities;Patient/family education;Manual techniques;Dry needling;Passive range of motion;Scar mobilization;Spinal Manipulations;Joint Manipulations    PT Next Visit Plan  re-assess pelvic allignment, re-assess leg-length following alignment, posture education. Check L QL Perform internal assessment if appropriate.    PT Home Exercise Plan  bow-and-arrow, side-stretch, self TP release and diaphragmatic breathing, piriformis stretch, dilator use, vaginal lubricants and moisturizers, self QL TP release, vit. E/coconut oil suppositories.    Consulted and Agree with Plan of Care  Patient       Patient will benefit from skilled therapeutic intervention in order to improve the following deficits and impairments:  Pain, Postural dysfunction, Increased muscle spasms, Decreased scar mobility, Decreased mobility, Decreased coordination, Decreased range of motion  Visit Diagnosis: 1. Other muscle spasm   2. Abnormal posture        Problem List There are no active problems to display for this patient.  Willa Rough DPT, ATC Willa Rough 12/29/2018, 4:14 PM  [place] MAIN [facility] SERVICES [address] [place], [place], [address] Phone: [phone]   Fax:  [phone]  Name: Anna [name] MRN: [id number] Date of Birth: 1955/11/16

## 2019-01-05 ENCOUNTER — Ambulatory Visit: Payer: PRIVATE HEALTH INSURANCE

## 2019-01-05 ENCOUNTER — Other Ambulatory Visit: Payer: Self-pay

## 2019-01-05 DIAGNOSIS — M62838 Other muscle spasm: Secondary | ICD-10-CM

## 2019-01-05 DIAGNOSIS — R293 Abnormal posture: Secondary | ICD-10-CM

## 2019-01-05 NOTE — Therapy (Signed)
[place] MAIN [facility] SERVICES [address] [place], [place], [address] Phone: [phone]   Fax:  [phone]  Physical Therapy Treatment  The patient has been informed of current processes in place at Outpatient Rehab to protect patients from Covid-19 exposure including social distancing, schedule modifications, and new cleaning procedures. After discussing their particular risk with a therapist based on the patient's personal risk factors, the patient has decided to proceed with in-person therapy.   Patient Details  Name: Anna [name] MRN: [id number] Date of Birth: [birth year] Referring Provider (PT): Benjaman Kindler   Encounter Date: 01/05/2019  PT End of Session - 01/05/19 1258    Visit Number  6    Number of Visits  10    Date for PT Re-Evaluation  02/02/19    Authorization Type  medcost    Authorization Time Period  through 02/02/2019    Authorization - Visit Number  6    Authorization - Number of Visits  10    PT Start Time  1000    PT Stop Time  1057    PT Time Calculation (min)  57 min    Activity Tolerance  Patient tolerated treatment well;No increased pain    Behavior During Therapy  WFL for tasks assessed/performed       Past Medical History:  Diagnosis Date  . Hx of squamous cell carcinoma of skin 04/25/2015   L chest parasternal    No past surgical history on file.  There were no vitals filed for this visit.    Pelvic Floor Physical Therapy Treatment Note  SCREENING  Changes in medications, allergies, or medical history?: none    SUBJECTIVE  Patient reports: Put out a bunch of mulch over the weekend, is a little sore from that. Neck has been hurting all week.  Precautions:  Hx. Of skin CA.  Pain update:  Location of pain: neck (4) LB (2) Current pain:  4/10  Max pain:  4/10 Least pain:  4/10 Nature of pain: dull ache  *0/10 following treatment.  Patient Goals: To be able to have sex without  being in pain.   OBJECTIVE  Changes in: Posture/Observations:  Standing with knees locked.  Range of Motion/Flexibilty:  ~ 45 degrees of lateral cervical flexion B pre-treatment in supine, WNL following treatment.   Decreased mobility at C1-C2, C1 prominent on R, C2 spinous process prominent posteriorly.  Palpation: TTP to B SCM, medial and anterior scalenes and cervical extensors on R>L.  INTERVENTIONS THIS SESSION: Manual: Performed TP release to  B SCM, medial and anterior scalenes and cervical extensors on R>L to to decrease spasm and pain and allow for improved balance of musculature for improved function and decreased symptoms. Performed grade 3-4 R to L lateral PIVM and PA PIVM to C1-C2 to improve mobility of joint and surrounding connective tissue and decrease pressure on nerve roots for improved conductivity and function of down-stream tissues and decrease pain.    Total time: 57 min.                            PT Short Term Goals - 01/05/19 1006      PT SHORT TERM GOAL #1   Title  Patient will demonstrate improved pelvic alignment and balance of musculature surrounding the pelvis to facilitate decreased PFM spasms and decrease pelvic pain.    Baseline  Pt. Demos spasms in R adductor and Psoas, R innominate  up-slipped As of 7/13: level and no spasms    Time  5    Period  Weeks    Status  Achieved    Target Date  12/29/18      PT SHORT TERM GOAL #2   Title  Patient will demonstrate HEP x1 in the clinic to demonstrate understanding and proper form to allow for further improvement.    Baseline  Pt. lacks knowledge of therapeutic exercises that will decrease her pain.    Time  5    Period  Weeks    Status  Achieved    Target Date  12/29/18      PT SHORT TERM GOAL #3   Title  Patient will report a reduction in pain to no greater than 3/10 over the prior week to demonstrate symptom improvement.    Baseline  6/10 with intercourse, Pt. has not yet  tried intercourse, has had neck pain of 4/10 over prior week    Time  5    Period  Weeks    Status  Unable to assess    Target Date  12/29/18        PT Long Term Goals - 11/24/18 1502      PT LONG TERM GOAL #1   Title  Patient will report no pain with intercourse to demonstrate improved functional ability.    Baseline  Pt. is in 6/10 pain with intercourse.    Time  10    Period  Weeks    Status  New    Target Date  02/02/19      PT LONG TERM GOAL #2   Title  Patient will score less than or equal to 15% on the Female NIH-CPSI and 6% on the VQ to demonstrate a reduction in pain, urinary symptoms, and an improved quality of life.    Baseline  VQ: 4/33 (12%), Female NIH-CPSI: 13/43 (30%)    Time  10    Period  Weeks    Target Date  02/02/19      PT LONG TERM GOAL #3   Title  Patient will demonstrate appropriate body mechanics with bending and lifting heavy objects to allow for decreased stress on the pelvic floor and low back.     Baseline  Pt demos genu valgus with BW squat, contributing to adductor spasms and PFM spasms.    Time  10    Period  Weeks    Status  New    Target Date  02/02/19            Plan - 01/05/19 1259    Clinical Impression Statement  Pt. Pt. Responded well to all interventions today, demonstrating improved AROM, PIVM, and decreased pain from 4/10 to 0/10 as well as understanding and correct performance of all education and exercises provided today. They will continue to benefit from skilled physical therapy to work toward remaining goals and maximize function as well as decrease likelihood of symptom increase or recurrence.    Personal Factors and Comorbidities  Comorbidity 1    Comorbidities  Lichen Sclerosis    Examination-Activity Limitations  Other   intercourse   Examination-Participation Restrictions  Interpersonal Relationship    Stability/Clinical Decision Making  Evolving/Moderate complexity    Rehab Potential  Good    PT Frequency  1x /  week    PT Duration  --   10 weeks   PT Treatment/Interventions  ADLs/Self Care Home Management;Biofeedback;Electrical Stimulation;Traction;Moist Heat;Functional mobility training;Neuromuscular re-education;Therapeutic exercise;Therapeutic activities;Patient/family education;Manual techniques;Dry  needling;Passive range of motion;Scar mobilization;Spinal Manipulations;Joint Manipulations    PT Next Visit Plan  re-assess pelvic allignment, re-assess leg-length following alignment,  Teach thoracic ext over towel roll! posture education. Check L QL Perform internal assessment if appropriate.    PT Home Exercise Plan  bow-and-arrow, side-stretch, self TP release and diaphragmatic breathing, piriformis stretch, dilator use, vaginal lubricants and moisturizers, self QL TP release, vit. E/coconut oil suppositories. neck stretches all directions, self TP release to neck/shoulders with ball/cane.    Consulted and Agree with Plan of Care  Patient       Patient will benefit from skilled therapeutic intervention in order to improve the following deficits and impairments:  Pain, Postural dysfunction, Increased muscle spasms, Decreased scar mobility, Decreased mobility, Decreased coordination, Decreased range of motion  Visit Diagnosis: 1. Other muscle spasm   2. Abnormal posture        Problem List There are no active problems to display for this patient.  Willa Rough DPT, ATC Willa Rough 01/05/2019, 1:10 PM  [place] MAIN [facility] SERVICES [address] [place], [place], [address] Phone: [phone]   Fax:  [phone]  Name: Anna [name] MRN: [id number] Date of Birth: 06/09/1956

## 2019-01-12 ENCOUNTER — Ambulatory Visit: Payer: PRIVATE HEALTH INSURANCE

## 2019-01-12 ENCOUNTER — Other Ambulatory Visit: Payer: Self-pay

## 2019-01-12 DIAGNOSIS — M62838 Other muscle spasm: Secondary | ICD-10-CM

## 2019-01-12 DIAGNOSIS — R293 Abnormal posture: Secondary | ICD-10-CM

## 2019-01-12 NOTE — Therapy (Signed)
[place] MAIN [facility] SERVICES [address] [place], [place], [address] Phone: [phone]   Fax:  [phone]  Physical Therapy Treatment  The patient has been informed of current processes in place at Outpatient Rehab to protect patients from Covid-19 exposure including social distancing, schedule modifications, and new cleaning procedures. After discussing their particular risk with a therapist based on the patient's personal risk factors, the patient has decided to proceed with in-person therapy.   Patient Details  Name: Anna [name] MRN: [id number] Date of Birth: [birth year] Referring Provider (PT): Benjaman Kindler   Encounter Date: 01/12/2019  PT End of Session - 01/12/19 1406    Visit Number  7    Number of Visits  10    Date for PT Re-Evaluation  02/02/19    Authorization Type  medcost    Authorization Time Period  through 02/02/2019    Authorization - Visit Number  7    Authorization - Number of Visits  10    PT Start Time  1000    PT Stop Time  1100    PT Time Calculation (min)  60 min    Activity Tolerance  Patient tolerated treatment well;No increased pain    Behavior During Therapy  WFL for tasks assessed/performed       Past Medical History:  Diagnosis Date  . Hx of squamous cell carcinoma of skin 04/25/2015   L chest parasternal    No past surgical history on file.  There were no vitals filed for this visit.    Pelvic Floor Physical Therapy Treatment Note  SCREENING  Changes in medications, allergies, or medical history?:  none   SUBJECTIVE  Patient reports: Is doing well, head and neck feeling good, has worked up to the 4th dilator but is paused there, it is more uncomfortable   Precautions:  Hx. Of skin CA.  Pain update:  Location of pain: B shoulders  Current pain:  3/10  Max pain:  4/10 Least pain:  2/10 Nature of pain: dull ache  Patient Goals: To be able to have sex without being in  pain.   OBJECTIVE  Changes in: Posture/Observations:  L knee faces medially when knees fully extended (likely femoral torsion), improves when knees soft.  Range of Motion/Flexibilty:  Decreased mobility surrounding sacrum and through thoracic spine.  Palpation: TTP to R QL, L Iliacus  B Piriformis, OI, Glute min   INTERVENTIONS THIS SESSION: Therex: Educated on and practiced thoracic extension over towel roll, scapular retractions and chin-tucks to improve mobility of thoracic and cervical spine and improve alignment to allow for improved pelvic tilt and PFM functioning. Manual: Performed TP release to B Piriformis, OI, Glute min followed by PA grade 3-4 mobs to to decrease spasm and pain and allow for improved balance of musculature for improved function and decreased symptoms and to improve mobility of joint and surrounding connective tissue and decrease pressure on nerve roots for improved conductivity and function of down-stream tissues.  Total time: 60 min.                           PT Short Term Goals - 01/05/19 1006      PT SHORT TERM GOAL #1   Title  Patient will demonstrate improved pelvic alignment and balance of musculature surrounding the pelvis to facilitate decreased PFM spasms and decrease pelvic pain.    Baseline  Pt. Demos spasms in R adductor and  Psoas, R innominate up-slipped As of 7/13: level and no spasms    Time  5    Period  Weeks    Status  Achieved    Target Date  12/29/18      PT SHORT TERM GOAL #2   Title  Patient will demonstrate HEP x1 in the clinic to demonstrate understanding and proper form to allow for further improvement.    Baseline  Pt. lacks knowledge of therapeutic exercises that will decrease her pain.    Time  5    Period  Weeks    Status  Achieved    Target Date  12/29/18      PT SHORT TERM GOAL #3   Title  Patient will report a reduction in pain to no greater than 3/10 over the prior week to demonstrate  symptom improvement.    Baseline  6/10 with intercourse, Pt. has not yet tried intercourse, has had neck pain of 4/10 over prior week    Time  5    Period  Weeks    Status  Unable to assess    Target Date  12/29/18        PT Long Term Goals - 11/24/18 1502      PT LONG TERM GOAL #1   Title  Patient will report no pain with intercourse to demonstrate improved functional ability.    Baseline  Pt. is in 6/10 pain with intercourse.    Time  10    Period  Weeks    Status  New    Target Date  02/02/19      PT LONG TERM GOAL #2   Title  Patient will score less than or equal to 15% on the Female NIH-CPSI and 6% on the VQ to demonstrate a reduction in pain, urinary symptoms, and an improved quality of life.    Baseline  VQ: 4/33 (12%), Female NIH-CPSI: 13/43 (30%)    Time  10    Period  Weeks    Target Date  02/02/19      PT LONG TERM GOAL #3   Title  Patient will demonstrate appropriate body mechanics with bending and lifting heavy objects to allow for decreased stress on the pelvic floor and low back.     Baseline  Pt demos genu valgus with BW squat, contributing to adductor spasms and PFM spasms.    Time  10    Period  Weeks    Status  New    Target Date  02/02/19            Plan - 01/12/19 1406    Clinical Impression Statement Pt. Responded well to all interventions today, demonstrating improved sacral mobility, decreased spasms and pain, as well as understanding and correct performance of all education and exercises provided today. They will continue to benefit from skilled physical therapy to work toward remaining goals and maximize function as well as decrease likelihood of symptom increase or recurrence.     PT Next Visit Plan  Perform internal assessment if appropriate. re-assess leg-length following alignment, posture education.    PT Home Exercise Plan  bow-and-arrow, side-stretch, self TP release and diaphragmatic breathing, piriformis stretch, dilator use, vaginal  lubricants and moisturizers, self QL TP release, vit. E/coconut oil suppositories. neck stretches all directions, self TP release to neck/shoulders with ball/cane, chin-tuck, scap retractions, thoracic ext over towel    Consulted and Agree with Plan of Care  Patient       Patient will  benefit from skilled therapeutic intervention in order to improve the following deficits and impairments:     Visit Diagnosis: 1. Other muscle spasm   2. Abnormal posture        Problem List There are no active problems to display for this patient.  Willa Rough DPT, ATC Willa Rough 01/12/2019, 2:08 PM  [place] MAIN [facility] SERVICES [address] [place], [place], [address] Phone: [phone]   Fax:  [phone]  Name: Anna [name] MRN: [id number] Date of Birth: 09/07/1955

## 2019-01-12 NOTE — Patient Instructions (Addendum)
   Place foam roller or towel under your upper back between your shoulder blades. Support your head with your hands, elbows forward, and gently rock back and forth and side to side to improve motion in your back.   Move the foam roller or towel up and down to a few spots in the upper back, repeating the process.    Shoulder Retraction and Downward Rotation   Rotate the shoulder blades back and down as if you had to hold a pencil between them, holding for 1 full second each time. Repeat this _3x10_ times _1-3_ times per day.      Start with Shoulder Retraction and Downward Rotation pictures above, holding the position while pulling the chin straight back as if trying to make a "double chin".  Breathe in forward and breathe out as you pull back, repeating this _3x10__ times _1-3___ times per day.

## 2019-01-19 ENCOUNTER — Other Ambulatory Visit: Payer: Self-pay

## 2019-01-19 ENCOUNTER — Ambulatory Visit: Payer: PRIVATE HEALTH INSURANCE

## 2019-01-19 DIAGNOSIS — R293 Abnormal posture: Secondary | ICD-10-CM

## 2019-01-19 DIAGNOSIS — M62838 Other muscle spasm: Secondary | ICD-10-CM | POA: Diagnosis not present

## 2019-01-19 NOTE — Patient Instructions (Addendum)
As you start wearing your heel-lift only wear it for an hour the first day and increase by an hour each day so you can allow for the body to adapt to the change easily without much pain. If your pain increases by more than 1-2 points, back off slightly or slow down how quickly you increase your wear time. Once you reach a full day of wear, use it as much as possible forever, even in house-shoes or flip-flops if necessary to keep yourself from reverting to bad pelvic and spinal alignment and having symptoms return.    Adjust-a-lift heel lift Can be found at [email or web address]  Self Internal Trigger Point Relief    1) Wash your hands and prop yourself up in a way where you can easily reach the vagina. You may wish to have a small hand-held mirror near by.  2) lubricate the tool and vaginal opening using a hypoallergenic lubricant such as "slippery-stuff".   3) Slowly and gently insert the tool into the vagina using deep breaths to allow relaxation of the muscles around the tool.  4) Avoiding the "12 o-clock" region near the urethra, gently use the handle of the tool like a lever to press the angled tip of the tool onto the wall of the pelvic floor.   5) Move the tool to different areas of the pelvic floor and feel for areas that are tender called "trigger points". When you find one hold the tool still, applying just enough pressure to elicit mild discomfort and take deep belly breaths until the discomfort subsides or decreases by at least 50%.   6) Repeat the process for any trigger points you find spending between 3-10 minutes on this per night until you do not find any more trigger points or you are told otherwise by your therapist..  Intimate Rose internal trigger point release tool [phone]  Dr. Kathline Magic Premium Prostate Massager   $19.99  Self Posterior Fourchette Stretching/Mobilization    1) Wash your hands and prop your body up so you can easily reach the vagina, bring hand-held  mirror if desired.  2) Apply lubricant to the thumb and vaginal opening  3) Place thumb ~ 1/2 an inch into the vagina with the pad of the thumb pointed down and apply gentle pressure to the posterior fourchette.  4) Gently sweep the thumb side to side and in/out while maintaining pressure down toward the anus. Make sure the pressure is not so great that your muscles tighten up and guard, just enough to create slight discomfort.  Do this for ~ 3 min. Per night to decrease tightness and tenderness at the vaginal opening.

## 2019-01-19 NOTE — Therapy (Signed)
[place] MAIN [facility] SERVICES [address] [place], [place], [address] Phone: [phone]   Fax:  [phone]  Physical Therapy Treatment  The patient has been informed of current processes in place at Outpatient Rehab to protect patients from Covid-19 exposure including social distancing, schedule modifications, and new cleaning procedures. After discussing their particular risk with a therapist based on the patient's personal risk factors, the patient has decided to proceed with in-person therapy.   Patient Details  Name: Anna [name] MRN: [id number] Date of Birth: [birth year] Referring Provider (PT): Benjaman Kindler   Encounter Date: 01/19/2019    Past Medical History:  Diagnosis Date  . Hx of squamous cell carcinoma of skin 04/25/2015   L chest parasternal    No past surgical history on file.  There were no vitals filed for this visit.    Pelvic Floor Physical Therapy Treatment Note  SCREENING  Changes in medications, allergies, or medical history?:  none   SUBJECTIVE  Patient reports: Is doing well, head and neck feeling a little better, can tell her exercises have helped. Is up to Dilator #5. No attempt at intercourse  Precautions:  Hx. Of skin CA.  Pain update:  Location of pain: B shoulders  Current pain:  2/10  Max pain:  3/10 Least pain:  2/10 Nature of pain: dull ache  Patient Goals: To be able to have sex without being in pain.   OBJECTIVE  Changes in: Posture/Observations:  L knee faces medially when knees fully extended (likely femoral torsion), improves when knees soft.  RLE long in supine and sit.   Range of Motion/Flexibilty:  Decreased mobility surrounding sacrum and through thoracic spine.  Palpation: TTP to R QL, L Iliacus  B Piriformis, OI, Glute min   INTERVENTIONS THIS SESSION: Manual: Performed internal assessment and educated Pt. On how to perform self internal TP release  and where to purchase tool to to decrease spasm and pain and allow for improved balance of musculature for improved function and decreased symptoms. Re-measured leg-length. Self-care: Educated on heel-lift wear gradual increase to allow  Acceptance and decrease pain as well as the need to get a arch support for the R foot    Total time: 60 min.                            PT Short Term Goals - 01/05/19 1006      PT SHORT TERM GOAL #1   Title  Patient will demonstrate improved pelvic alignment and balance of musculature surrounding the pelvis to facilitate decreased PFM spasms and decrease pelvic pain.    Baseline  Pt. Demos spasms in R adductor and Psoas, R innominate up-slipped As of 7/13: level and no spasms    Time  5    Period  Weeks    Status  Achieved    Target Date  12/29/18      PT SHORT TERM GOAL #2   Title  Patient will demonstrate HEP x1 in the clinic to demonstrate understanding and proper form to allow for further improvement.    Baseline  Pt. lacks knowledge of therapeutic exercises that will decrease her pain.    Time  5    Period  Weeks    Status  Achieved    Target Date  12/29/18      PT SHORT TERM GOAL #3   Title  Patient will report a reduction in pain  to no greater than 3/10 over the prior week to demonstrate symptom improvement.    Baseline  6/10 with intercourse, Pt. has not yet tried intercourse, has had neck pain of 4/10 over prior week    Time  5    Period  Weeks    Status  Unable to assess    Target Date  12/29/18        PT Long Term Goals - 11/24/18 1502      PT LONG TERM GOAL #1   Title  Patient will report no pain with intercourse to demonstrate improved functional ability.    Baseline  Pt. is in 6/10 pain with intercourse.    Time  10    Period  Weeks    Status  New    Target Date  02/02/19      PT LONG TERM GOAL #2   Title  Patient will score less than or equal to 15% on the Female NIH-CPSI and 6% on the VQ to  demonstrate a reduction in pain, urinary symptoms, and an improved quality of life.    Baseline  VQ: 4/33 (12%), Female NIH-CPSI: 13/43 (30%)    Time  10    Period  Weeks    Target Date  02/02/19      PT LONG TERM GOAL #3   Title  Patient will demonstrate appropriate body mechanics with bending and lifting heavy objects to allow for decreased stress on the pelvic floor and low back.     Baseline  Pt demos genu valgus with BW squat, contributing to adductor spasms and PFM spasms.    Time  10    Period  Weeks    Status  New    Target Date  02/02/19              Patient will benefit from skilled therapeutic intervention in order to improve the following deficits and impairments:     Visit Diagnosis: No diagnosis found.     Problem List There are no active problems to display for this patient.  Willa Rough DPT, ATC Willa Rough 01/19/2019, 10:04 AM  [place] MAIN [facility] SERVICES [address] [place], [place], [address] Phone: [phone]   Fax:  [phone]  Name: Anna [name] MRN: [id number] Date of Birth: [birth year]

## 2019-01-26 ENCOUNTER — Other Ambulatory Visit: Payer: Self-pay

## 2019-01-26 ENCOUNTER — Ambulatory Visit: Payer: PRIVATE HEALTH INSURANCE | Attending: Obstetrics and Gynecology

## 2019-01-26 DIAGNOSIS — R293 Abnormal posture: Secondary | ICD-10-CM | POA: Diagnosis present

## 2019-01-26 DIAGNOSIS — M62838 Other muscle spasm: Secondary | ICD-10-CM | POA: Diagnosis present

## 2019-01-26 NOTE — Patient Instructions (Signed)
Hip Abduction: Side Leg Lift - Side-Lying    Lie on side. Draw lower tummy muscle (TA) and pelvic floor in, Lift top leg until you feel strong contraction of muscle on the side of the hip. Keep top leg straight with body, toes pointing forward. Do not let your hip roll back! Do _10__ reps per set, __3_ sets per day  Hip Extension:    Lying face down, raise leg just off floor squeezing glutes. Keep leg straight. Hold 1 count. Lower leg to floor. Do _10__ reps per set, __3_ sets for each leg. Optional: Place small pillow under abdomen if back becomes uncomfortable.

## 2019-01-26 NOTE — Therapy (Signed)
[place] MAIN [facility] SERVICES [address] [place], [place], [address] Phone: [phone]   Fax:  [phone]  Physical Therapy Treatment  The patient has been informed of current processes in place at Outpatient Rehab to protect patients from Covid-19 exposure including social distancing, schedule modifications, and new cleaning procedures. After discussing their particular risk with a therapist based on the patient's personal risk factors, the patient has decided to proceed with in-person therapy.   Patient Details  Name: Anna [name] MRN: [id number] Date of Birth: March 02, 1956 Referring Provider (PT): Benjaman Kindler   Encounter Date: 01/26/2019  PT End of Session - 01/26/19 1649    Visit Number  9    Number of Visits  10    Date for PT Re-Evaluation  02/02/19    Authorization Type  medcost    Authorization Time Period  through 02/02/2019    Authorization - Visit Number  9    Authorization - Number of Visits  10    PT Start Time  1440    PT Stop Time  7793    PT Time Calculation (min)  50 min    Activity Tolerance  Patient tolerated treatment well;No increased pain    Behavior During Therapy  WFL for tasks assessed/performed       Past Medical History:  Diagnosis Date  . Hx of squamous cell carcinoma of skin 04/25/2015   L chest parasternal    No past surgical history on file.  There were no vitals filed for this visit.      Pelvic Floor Physical Therapy Treatment Note  SCREENING  Changes in medications, allergies, or medical history?:  none   SUBJECTIVE  Patient reports: She was able to have intercourse and it was almost "enjoyable"!   Precautions:  Hx. Of skin CA.  Pain update:  Location of pain: neck Current pain:  1/10  Max pain:  3/10 Least pain:  1/10 Nature of pain: dull ache  Patient Goals: To be able to have sex without being in pain.   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   Range of Motion/Flexibilty:  C2 and C6 deviated R and decreased mobility  Palpation: TTP through R cervical extensors and upper traps.  INTERVENTIONS THIS SESSION: Manual: Performed TP release to R cervical extensors and upper traps to decrease spasm and pain and allow for improved balance of musculature for improved function and decreased symptoms and grade 3-4 R to L mobs to C2 and C6 spinous processes to improve mobility of joint and surrounding connective tissue and decrease pressure on nerve roots for improved conductivity and function of down-stream tissues.  Therex: educated on and practiced hip EXT and ABD to decrease medial knee pain and stabilize the pelvis to allow for greater PFM relaxation and decreased pain.   Total time: 60 min.                          PT Short Term Goals - 01/19/19 1320      PT SHORT TERM GOAL #1   Title  Patient will demonstrate improved pelvic alignment and balance of musculature surrounding the pelvis to facilitate decreased PFM spasms and decrease pelvic pain.    Baseline  Pt. Demos spasms in R adductor and Psoas, R innominate up-slipped As of 7/13: level and no spasms    Time  5    Period  Weeks  Status  Achieved    Target Date  12/29/18      PT SHORT TERM GOAL #2   Title  Patient will demonstrate HEP x1 in the clinic to demonstrate understanding and proper form to allow for further improvement.    Baseline  Pt. lacks knowledge of therapeutic exercises that will decrease her pain.    Time  5    Period  Weeks    Status  Achieved    Target Date  12/29/18      PT SHORT TERM GOAL #3   Title  Patient will report a reduction in pain to no greater than 3/10 over the prior week to demonstrate symptom improvement.    Baseline  6/10 with intercourse, Pt. has not yet tried intercourse, has had neck pain of 4/10 over prior week    Time  5    Period  Weeks    Status  On-going    Target Date   02/23/19        PT Long Term Goals - 01/19/19 1318      PT LONG TERM GOAL #1   Title  Patient will report no pain with intercourse to demonstrate improved functional ability.    Baseline  Pt. is in 6/10 pain with intercourse.    Time  10    Period  Weeks    Status  On-going    Target Date  03/30/19      PT LONG TERM GOAL #2   Title  Patient will score less than or equal to 15% on the Female NIH-CPSI and 6% on the VQ to demonstrate a reduction in pain, urinary symptoms, and an improved quality of life.    Baseline  VQ: 4/33 (12%), Female NIH-CPSI: 13/43 (30%)    Time  10    Period  Weeks    Status  On-going    Target Date  03/30/19      PT LONG TERM GOAL #3   Title  Patient will demonstrate appropriate body mechanics with bending and lifting heavy objects to allow for decreased stress on the pelvic floor and low back.     Baseline  Pt demos genu valgus with BW squat, contributing to adductor spasms and PFM spasms.    Time  10    Period  Weeks    Status  Achieved    Target Date  02/02/19            Plan - 01/26/19 1650    Clinical Impression Statement  Pt. Responded well to all interventions today, demonstrating improved cervical alignment and decreased pain as well as understanding and correct performance of all education and exercises provided today. They will continue to benefit from skilled physical therapy to work toward remaining goals and maximize function as well as decrease likelihood of symptom increase or recurrence.     Personal Factors and Comorbidities  Comorbidity 1    Comorbidities  Lichen Sclerosis    Examination-Activity Limitations  Other    Examination-Participation Restrictions  Interpersonal Relationship    Stability/Clinical Decision Making  Evolving/Moderate complexity    Rehab Potential  Good    PT Frequency  1x / week    PT Duration  Other (comment)   10 weeks   PT Treatment/Interventions  ADLs/Self Care Home Management;Biofeedback;Electrical  Stimulation;Traction;Moist Heat;Functional mobility training;Neuromuscular re-education;Therapeutic exercise;Therapeutic activities;Patient/family education;Manual techniques;Dry needling;Passive range of motion;Scar mobilization;Spinal Manipulations;Joint Manipulations    PT Next Visit Plan  re-assess goals, go over HEP/upgrade PRN, discuss progression, determine  if D/C or decrease frequency.    PT Home Exercise Plan  bow-and-arrow, side-stretch, self TP release and diaphragmatic breathing, piriformis stretch, dilator use, vaginal lubricants and moisturizers, self QL TP release, vit. E/coconut oil suppositories. neck stretches all directions, self TP release to neck/shoulders with ball/cane, chin-tuck, scap retractions, thoracic ext over towel, heel-lift in L shoe, hip ext/ABD    Consulted and Agree with Plan of Care  Patient       Patient will benefit from skilled therapeutic intervention in order to improve the following deficits and impairments:  Pain, Postural dysfunction, Increased muscle spasms, Decreased scar mobility, Decreased mobility, Decreased coordination, Decreased range of motion  Visit Diagnosis: 1. Other muscle spasm   2. Abnormal posture        Problem List There are no active problems to display for this patient.  Willa Rough DPT, ATC Willa Rough 01/26/2019, 5:00 PM  [place] MAIN [facility] SERVICES [address] [place], [place], [address] Phone: [phone]   Fax:  [phone]  Name: Anna [name] MRN: [id number] Date of Birth: 03-Dec-1955

## 2019-02-02 ENCOUNTER — Other Ambulatory Visit: Payer: Self-pay

## 2019-02-02 ENCOUNTER — Ambulatory Visit: Payer: PRIVATE HEALTH INSURANCE

## 2019-02-02 DIAGNOSIS — M62838 Other muscle spasm: Secondary | ICD-10-CM

## 2019-02-02 DIAGNOSIS — R293 Abnormal posture: Secondary | ICD-10-CM

## 2019-02-02 NOTE — Therapy (Signed)
[place] MAIN [facility] SERVICES [address] [place], [place], [address] Phone: [phone]   Fax:  [phone]  Physical Therapy Treatment  The patient has been informed of current processes in place at Outpatient Rehab to protect patients from Covid-19 exposure including social distancing, schedule modifications, and new cleaning procedures. After discussing their particular risk with a therapist based on the patient's personal risk factors, the patient has decided to proceed with in-person therapy.   Patient Details  Name: Anna [name] MRN: [id number] Date of Birth: August 06, 1955 Referring Provider (PT): Benjaman Kindler   Encounter Date: 02/02/2019  PT End of Session - 02/02/19 1007    Visit Number  10    Number of Visits  10    Date for PT Re-Evaluation  02/02/19    Authorization Type  medcost    Authorization Time Period  through 02/02/2019    Authorization - Visit Number  10    Authorization - Number of Visits  10    PT Start Time  1003    Activity Tolerance  Patient tolerated treatment well;No increased pain    Behavior During Therapy  WFL for tasks assessed/performed       Past Medical History:  Diagnosis Date  . Hx of squamous cell carcinoma of skin 04/25/2015   L chest parasternal    No past surgical history on file.  There were no vitals filed for this visit.    Pelvic Floor Physical Therapy Treatment Note  SCREENING  Changes in medications, allergies, or medical history?:  none   SUBJECTIVE  Patient reports: Neck is feeling better, has been having HA. Has been able to have intercourse without high pain, still having some with initial penetration and throughout though it is much less and feels more like burning (lichen).  Precautions:  Hx. Of skin CA.  Pain update:  Location of pain: HA, (vaginally with intercourse Current pain:  1/10 ) Max pain:  4/10 (3 vaginally with intercourse) Least pain:   1/10 Nature of pain: dull ache (burning)  Patient Goals: To be able to have sex without being in pain.   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   Range of Motion/Flexibilty:  C2 mildly deviated R and decreased mobility through thoracic spine.  Palpation: TTP through R sub-occipitals  INTERVENTIONS THIS SESSION: Manual: Performed grade 3-4 PA mobs through ~T4-T8 and C2 and TP release to R sub-occipitals to to decrease spasm and pain and allow for improved balance of musculature for improved function and decreased symptoms and to improve mobility of joint and surrounding connective tissue and decrease pressure on nerve roots for improved conductivity and function of down-stream tissues to help prevent recurrence of HA. Therex: educated on and practiced prone chin tucks to progress strengthening of deep cervical extensors and help decrease FHP and HA. Reviewed thoracic extensions over a towel roll to maintain and improve thoracic mobility and surrounding connective tissue to improve mobility through posterior chain and decrease tension on that is contributing to HA. Self-care: reviewed goals and plan, discussed frequency of use for suppositories and external moisturizers.  Total time: 60 min.                             PT Short Term Goals - 02/02/19 1009      PT SHORT TERM GOAL #1   Title  Patient will demonstrate improved pelvic alignment and balance of  musculature surrounding the pelvis to facilitate decreased PFM spasms and decrease pelvic pain.    Baseline  Pt. Demos spasms in R adductor and Psoas, R innominate up-slipped As of 7/13: level and no spasms    Time  5    Period  Weeks    Status  Achieved    Target Date  12/29/18      PT SHORT TERM GOAL #2   Title  Patient will demonstrate HEP x1 in the clinic to demonstrate understanding and proper form to allow for further improvement.    Baseline  Pt. lacks  knowledge of therapeutic exercises that will decrease her pain.    Time  5    Period  Weeks    Status  Achieved    Target Date  12/29/18      PT SHORT TERM GOAL #3   Title  Patient will report a reduction in pain to no greater than 3/10 over the prior week to demonstrate symptom improvement.    Baseline  6/10 with intercourse, Pt. has not yet tried intercourse, has had neck pain of 4/10 over prior week has only had a HA (4/10) and 3/10 with intercourse over past week    Time  5    Period  Weeks    Status  On-going    Target Date  02/23/19        PT Long Term Goals - 02/02/19 1010      PT LONG TERM GOAL #1   Title  Patient will report no pain with intercourse to demonstrate improved functional ability.    Baseline  Pt. is in 6/10 pain with intercourse. As of 8/10: 3/10 with intercourse (more burning than deep pain)    Time  10    Period  Weeks    Status  On-going    Target Date  03/30/19      PT LONG TERM GOAL #2   Title  Patient will score less than or equal to 15% on the Female NIH-CPSI and 6% on the VQ to demonstrate a reduction in pain, urinary symptoms, and an improved quality of life.    Baseline  VQ: 4/33 (12%), Female NIH-CPSI: 13/43 (30%) as of 8/10: VQ: 5/33 (15%)(pt. reports improvement, likely skewed by under-reporting at initial visit), Female NIH-CPSI: 13/43 (9%)    Time  10    Period  Weeks    Status  On-going    Target Date  03/30/19      PT LONG TERM GOAL #3   Title  Patient will demonstrate appropriate body mechanics with bending and lifting heavy objects to allow for decreased stress on the pelvic floor and low back.     Baseline  Pt demos genu valgus with BW squat, contributing to adductor spasms and PFM spasms.    Time  10    Period  Weeks    Status  Achieved    Target Date  02/02/19            Plan - 02/02/19 1008    Clinical Impression Statement  Pt. Responded well to all interventions today, demonstrating improved thoracic mobility and  resolution of HA, as well as understanding and correct performance of all education and exercises provided today. They will continue to benefit from skilled physical therapy to work toward remaining goals and maximize function as well as decrease likelihood of symptom increase or recurrence.     Personal Factors and Comorbidities  Comorbidity 1    Comorbidities  Lichen  Sclerosis    Examination-Activity Limitations  Other    Examination-Participation Restrictions  Interpersonal Relationship    Stability/Clinical Decision Making  Evolving/Moderate complexity    Rehab Potential  Good    PT Frequency  1x / week    PT Duration  Other (comment)   10 weeks   PT Treatment/Interventions  ADLs/Self Care Home Management;Biofeedback;Electrical Stimulation;Traction;Moist Heat;Functional mobility training;Neuromuscular re-education;Therapeutic exercise;Therapeutic activities;Patient/family education;Manual techniques;Dry needling;Passive range of motion;Scar mobilization;Spinal Manipulations;Joint Manipulations    PT Next Visit Plan  re-assess goals, go over HEP/upgrade PRN, discuss progression, determine if D/C or decrease frequency.    PT Home Exercise Plan  bow-and-arrow, side-stretch, self TP release and diaphragmatic breathing, piriformis stretch, dilator use, vaginal lubricants and moisturizers, self QL TP release, vit. E/coconut oil suppositories. neck stretches all directions, self TP release to neck/shoulders with ball/cane, chin-tuck, scap retractions, thoracic ext over towel, heel-lift in L shoe, hip ext/ABD    Consulted and Agree with Plan of Care  Patient       Patient will benefit from skilled therapeutic intervention in order to improve the following deficits and impairments:  Pain, Postural dysfunction, Increased muscle spasms, Decreased scar mobility, Decreased mobility, Decreased coordination, Decreased range of motion  Visit Diagnosis: 1. Other muscle spasm   2. Abnormal posture         Problem List There are no active problems to display for this patient.  Willa Rough DPT, ATC Willa Rough 02/02/2019, 11:19 AM  [place] MAIN [facility] SERVICES [address] [place], [place], [address] Phone: [phone]   Fax:  [phone]  Name: Anna [name] MRN: [id number] Date of Birth: [birth year]

## 2019-02-02 NOTE — Patient Instructions (Signed)
  Inhale as you rest, exhale pull your chin in like a turtle. Do 2x15. 1 time per day.

## 2019-02-16 ENCOUNTER — Ambulatory Visit: Payer: PRIVATE HEALTH INSURANCE

## 2019-02-16 ENCOUNTER — Other Ambulatory Visit: Payer: Self-pay

## 2019-02-16 DIAGNOSIS — M62838 Other muscle spasm: Secondary | ICD-10-CM | POA: Diagnosis not present

## 2019-02-16 DIAGNOSIS — R293 Abnormal posture: Secondary | ICD-10-CM

## 2019-02-16 NOTE — Therapy (Signed)
[place] MAIN [facility] SERVICES [address] [place], [place], [address] Phone: [phone]   Fax:  [phone]  Physical Therapy Treatment  The patient has been informed of current processes in place at Outpatient Rehab to protect patients from Covid-19 exposure including social distancing, schedule modifications, and new cleaning procedures. After discussing their particular risk with a therapist based on the patient's personal risk factors, the patient has decided to proceed with in-person therapy.   Patient Details  Name: Anna [name] MRN: [id number] Date of Birth: Dec 07, 1955 Referring Provider (PT): Benjaman Kindler   Encounter Date: 02/16/2019  PT End of Session - 02/16/19 1329    Visit Number  11    Number of Visits  20    Date for PT Re-Evaluation  03/30/19    Authorization Type  medcost    Authorization Time Period  through 02/02/2019    Authorization - Visit Number  1    Authorization - Number of Visits  10    PT Start Time  1000    PT Stop Time  1100    PT Time Calculation (min)  60 min    Activity Tolerance  Patient tolerated treatment well;No increased pain    Behavior During Therapy  WFL for tasks assessed/performed       Past Medical History:  Diagnosis Date  . Hx of squamous cell carcinoma of skin 04/25/2015   L chest parasternal    No past surgical history on file.  There were no vitals filed for this visit.      Pelvic Floor Physical Therapy Treatment Note  SCREENING  Changes in medications, allergies, or medical history?:  none   SUBJECTIVE  Patient reports: Is doing well, neck has been off-and-on hurting. Tried intercourse, not going well because he wants to rush. Hurts worst with initial penetration. Is on the last dilator, was able to keep it in for as long as recommended one time.  Precautions:  Hx. Of skin CA.  Pain update:  Location of pain: HA, (vaginally with intercourse Current pain:   2/10  Max pain:  4/10 (3 vaginally with intercourse) Least pain:  0/10 Nature of pain: dull ache (burning)  Patient Goals: To be able to have sex without being in pain.   OBJECTIVE  Changes in: Posture/Observations:  Mild FHP, continues to over-use cervical flexors.  Palpation: TTP to R adductors  Pelvic Floor External Exam: Introitus Appears: narrowed Skin integrity: lichen sclerosis Palpation: no TTP Cough: parodoxical Prolapse visible?: none Scar mobility: decreased mobility due to lichen  Internal Vaginal Exam: Strength (PERF): 3/5, 5 sec, 1 time Symmetry: symmetrical Palpation: TTP throughout ,B Prolapse: none   INTERVENTIONS THIS SESSION: Manual: Assessed PFM and performed TP release to areas of spasm internally to decrease spasm and pain and allow for improved balance of musculature for improved function and decreased symptoms. NM re-ed: Educated on pre-squeeze and sneeze to improve timing/recruitment of PFM and prevent POP or SUI.  Total time: 60 min.                                     PT Short Term Goals - 02/02/19 1009      PT SHORT TERM GOAL #1   Title  Patient will demonstrate improved pelvic alignment and balance of musculature surrounding the pelvis to facilitate decreased PFM spasms and decrease pelvic pain.    Baseline  Pt.  Demos spasms in R adductor and Psoas, R innominate up-slipped As of 7/13: level and no spasms    Time  5    Period  Weeks    Status  Achieved    Target Date  12/29/18      PT SHORT TERM GOAL #2   Title  Patient will demonstrate HEP x1 in the clinic to demonstrate understanding and proper form to allow for further improvement.    Baseline  Pt. lacks knowledge of therapeutic exercises that will decrease her pain.    Time  5    Period  Weeks    Status  Achieved    Target Date  12/29/18      PT SHORT TERM GOAL #3   Title  Patient will report a reduction in pain to no greater than 3/10 over  the prior week to demonstrate symptom improvement.    Baseline  6/10 with intercourse, Pt. has not yet tried intercourse, has had neck pain of 4/10 over prior week has only had a HA (4/10) and 3/10 with intercourse over past week    Time  5    Period  Weeks    Status  On-going    Target Date  02/23/19        PT Long Term Goals - 02/02/19 1010      PT LONG TERM GOAL #1   Title  Patient will report no pain with intercourse to demonstrate improved functional ability.    Baseline  Pt. is in 6/10 pain with intercourse. As of 8/10: 3/10 with intercourse (more burning than deep pain)    Time  10    Period  Weeks    Status  On-going    Target Date  03/30/19      PT LONG TERM GOAL #2   Title  Patient will score less than or equal to 15% on the Female NIH-CPSI and 6% on the VQ to demonstrate a reduction in pain, urinary symptoms, and an improved quality of life.    Baseline  VQ: 4/33 (12%), Female NIH-CPSI: 13/43 (30%) as of 8/10: VQ: 5/33 (15%)(pt. reports improvement, likely skewed by under-reporting at initial visit), Female NIH-CPSI: 13/43 (9%)    Time  10    Period  Weeks    Status  On-going    Target Date  03/30/19      PT LONG TERM GOAL #3   Title  Patient will demonstrate appropriate body mechanics with bending and lifting heavy objects to allow for decreased stress on the pelvic floor and low back.     Baseline  Pt demos genu valgus with BW squat, contributing to adductor spasms and PFM spasms.    Time  10    Period  Weeks    Status  Achieved    Target Date  02/02/19            Plan - 02/16/19 1513    Clinical Impression Statement  Pt. Responded well to all interventions today, demonstrating decreased spasms internally as well as understanding and correct performance of all education and exercises provided today. They will continue to benefit from skilled physical therapy to work toward remaining goals and maximize function as well as decrease likelihood of symptom increase  or recurrence.     Personal Factors and Comorbidities  Comorbidity 1    Comorbidities  Lichen Sclerosis    Examination-Activity Limitations  Other    Examination-Participation Restrictions  Interpersonal Relationship    Stability/Clinical Decision Making  Evolving/Moderate complexity    Clinical Decision Making  Moderate    Rehab Potential  Good    PT Frequency  1x / week    PT Duration  Other (comment)   10 weeks   PT Treatment/Interventions  ADLs/Self Care Home Management;Biofeedback;Electrical Stimulation;Traction;Moist Heat;Functional mobility training;Neuromuscular re-education;Therapeutic exercise;Therapeutic activities;Patient/family education;Manual techniques;Dry needling;Passive range of motion;Scar mobilization;Spinal Manipulations;Joint Manipulations    PT Next Visit Plan  go over HEP/upgrade PRN, discuss progression, determine if we should decrease frequency.    PT Home Exercise Plan  bow-and-arrow, side-stretch, self TP release and diaphragmatic breathing, piriformis stretch, dilator use, vaginal lubricants and moisturizers, self QL TP release, vit. E/coconut oil suppositories. neck stretches all directions, self TP release to neck/shoulders with ball/cane, chin-tuck, scap retractions, thoracic ext over towel, heel-lift in L shoe, hip ext/ABD    Consulted and Agree with Plan of Care  Patient       Patient will benefit from skilled therapeutic intervention in order to improve the following deficits and impairments:  Pain, Postural dysfunction, Increased muscle spasms, Decreased scar mobility, Decreased mobility, Decreased coordination, Decreased range of motion  Visit Diagnosis: Other muscle spasm  Abnormal posture     Problem List There are no active problems to display for this patient.  Willa Rough DPT, ATC Willa Rough 02/16/2019, 3:16 PM  [place] MAIN [facility] SERVICES [address] [place], [place], [address] Phone:  [phone]   Fax:  [phone]  Name: AMORY MARCELLINO MRN: [id number] Date of Birth: [birth year]

## 2019-02-16 NOTE — Patient Instructions (Signed)
"  Pre-squeeze and sneeze"  Before you cough, sneeze, laugh etc. "pre-squeeze" the pelvic floor (kegel) and hold it until you finish coughing to retrain the muscles to hold the urine in during these activities.   

## 2019-02-23 ENCOUNTER — Ambulatory Visit: Payer: PRIVATE HEALTH INSURANCE

## 2019-02-23 ENCOUNTER — Other Ambulatory Visit: Payer: Self-pay

## 2019-02-23 DIAGNOSIS — M62838 Other muscle spasm: Secondary | ICD-10-CM

## 2019-02-23 DIAGNOSIS — R293 Abnormal posture: Secondary | ICD-10-CM

## 2019-02-23 NOTE — Therapy (Signed)
[place] MAIN [facility] SERVICES [address] [place], [place], [address] Phone: [phone]   Fax:  [phone]  Physical Therapy Treatment  The patient has been informed of current processes in place at Outpatient Rehab to protect patients from Covid-19 exposure including social distancing, schedule modifications, and new cleaning procedures. After discussing their particular risk with a therapist based on the patient's personal risk factors, the patient has decided to proceed with in-person therapy.   Patient Details  Name: Anna [name] MRN: [id number] Date of Birth: [birth year] Referring Provider (PT): Benjaman Kindler   Encounter Date: 02/23/2019  PT End of Session - 02/23/19 1422    Visit Number  12    Number of Visits  20    Date for PT Re-Evaluation  03/30/19    Authorization Type  medcost    Authorization Time Period  through 02/02/2019    Authorization - Visit Number  2    Authorization - Number of Visits  10    PT Start Time  1000    PT Stop Time  1100    PT Time Calculation (min)  60 min    Activity Tolerance  Patient tolerated treatment well;No increased pain    Behavior During Therapy  WFL for tasks assessed/performed       Past Medical History:  Diagnosis Date  . Hx of squamous cell carcinoma of skin 04/25/2015   L chest parasternal    No past surgical history on file.  There were no vitals filed for this visit.    Pelvic Floor Physical Therapy Treatment Note  SCREENING  Changes in medications, allergies, or medical history?:  none   SUBJECTIVE  Patient reports: Attempt at intercourse was much better, maybe 1 sec of pain but went away and had no more.   Precautions:  Hx. Of skin CA.  Pain update:  Location of pain: HA, neck pain Current pain:  3/10  Max pain:  4/10  Least pain:  0/10 Nature of pain: dull ache   **No pain following treatment.  Patient Goals: To be able to have sex without being  in pain.   OBJECTIVE  Changes in: Mobility/ROM: Decreased C1-C2 mobility with R deviated spinous process of C2 and transverse process of C1. Radiating pain to region of frequent HA with PAIVM.  Palpation: TTP to T>L upper trap, SCM, and R sub-occipitals, scalenes, and cervical extensors  INTERVENTIONS THIS SESSION: Manual: Performed TP release to R upper trap, SCM,  sub-occipitals, scalenes, and cervical extensors to decrease spasm and pain and allow for improved balance of musculature for improved function and decreased symptoms. Performed grade 3-4 mobs from R to L at C1 and C2 to improve mobility of joint and surrounding connective tissue and decrease pressure on nerve roots for improved conductivity and function of down-stream tissues.  Self-care: Educated on using crisco and lube combo to decrease friction and pain with penetration as well as how to position to allow for greatest relaxation of the PFM and to control depth of penetration   Total time: 60 min.                               PT Short Term Goals - 02/02/19 1009      PT SHORT TERM GOAL #1   Title  Patient will demonstrate improved pelvic alignment and balance of musculature surrounding the pelvis to facilitate decreased PFM spasms and decrease pelvic  pain.    Baseline  Pt. Demos spasms in R adductor and Psoas, R innominate up-slipped As of 7/13: level and no spasms    Time  5    Period  Weeks    Status  Achieved    Target Date  12/29/18      PT SHORT TERM GOAL #2   Title  Patient will demonstrate HEP x1 in the clinic to demonstrate understanding and proper form to allow for further improvement.    Baseline  Pt. lacks knowledge of therapeutic exercises that will decrease her pain.    Time  5    Period  Weeks    Status  Achieved    Target Date  12/29/18      PT SHORT TERM GOAL #3   Title  Patient will report a reduction in pain to no greater than 3/10 over the prior week to demonstrate  symptom improvement.    Baseline  6/10 with intercourse, Pt. has not yet tried intercourse, has had neck pain of 4/10 over prior week has only had a HA (4/10) and 3/10 with intercourse over past week    Time  5    Period  Weeks    Status  On-going    Target Date  02/23/19        PT Long Term Goals - 02/02/19 1010      PT LONG TERM GOAL #1   Title  Patient will report no pain with intercourse to demonstrate improved functional ability.    Baseline  Pt. is in 6/10 pain with intercourse. As of 8/10: 3/10 with intercourse (more burning than deep pain)    Time  10    Period  Weeks    Status  On-going    Target Date  03/30/19      PT LONG TERM GOAL #2   Title  Patient will score less than or equal to 15% on the Female NIH-CPSI and 6% on the VQ to demonstrate a reduction in pain, urinary symptoms, and an improved quality of life.    Baseline  VQ: 4/33 (12%), Female NIH-CPSI: 13/43 (30%) as of 8/10: VQ: 5/33 (15%)(pt. reports improvement, likely skewed by under-reporting at initial visit), Female NIH-CPSI: 13/43 (9%)    Time  10    Period  Weeks    Status  On-going    Target Date  03/30/19      PT LONG TERM GOAL #3   Title  Patient will demonstrate appropriate body mechanics with bending and lifting heavy objects to allow for decreased stress on the pelvic floor and low back.     Baseline  Pt demos genu valgus with BW squat, contributing to adductor spasms and PFM spasms.    Time  10    Period  Weeks    Status  Achieved    Target Date  02/02/19            Plan - 02/23/19 1006    Clinical Impression Statement  Pt. Responded well to all interventions today, demonstrating a between-session decrease in pain with intercourse, improved cervical mobility and decreased spasm and pain as well as understanding of all education provided. They will continue to benefit from skilled physical therapy to work toward remaining goals and maximize function as well as decrease likelihood of symptom  increase or recurrence.     PT Next Visit Plan  give side-plank and teapot, cervical rotation with overpressure to R? discuss progression, determine if we should decrease frequency.  PT Home Exercise Plan  bow-and-arrow, side-stretch, self TP release and diaphragmatic breathing, piriformis stretch, dilator use, vaginal lubricants and moisturizers, self QL TP release, vit. E/coconut oil suppositories. neck stretches all directions, self TP release to neck/shoulders with ball/cane, chin-tuck, scap retractions, thoracic ext over towel, heel-lift in L shoe, hip ext/ABD       Patient will benefit from skilled therapeutic intervention in order to improve the following deficits and impairments:     Visit Diagnosis: Other muscle spasm  Abnormal posture     Problem List There are no active problems to display for this patient.  Willa Rough DPT, ATC Willa Rough 02/23/2019, 2:24 PM  [place] MAIN [facility] SERVICES [address] [place], [place], [address] Phone: [phone]   Fax:  [phone]  Name: COVA ROBLEZ MRN: [id number] Date of Birth: January 05, 1956

## 2019-03-09 ENCOUNTER — Ambulatory Visit: Payer: PRIVATE HEALTH INSURANCE

## 2019-03-12 ENCOUNTER — Ambulatory Visit: Payer: PRIVATE HEALTH INSURANCE | Attending: Obstetrics and Gynecology

## 2019-03-12 ENCOUNTER — Other Ambulatory Visit: Payer: Self-pay

## 2019-03-12 DIAGNOSIS — M62838 Other muscle spasm: Secondary | ICD-10-CM | POA: Diagnosis not present

## 2019-03-12 DIAGNOSIS — R293 Abnormal posture: Secondary | ICD-10-CM | POA: Diagnosis present

## 2019-03-12 NOTE — Patient Instructions (Signed)
3-Way Wall Stretches for Pelvic Floor Lengthening   Bring bottom close to the wall and gently press straighten knees to feel a stretch down the back of you thighs. Hold while taking 5 deep belly breaths and feeling the pelvic floor relax and lower on each inhale.    Let your legs fall to the side to feel a stretch on the inside of your thighs. If the stretch is too intense you can use a pillow to take some of the weight off by wedging it on the outside of your hips. Hold while taking 5 deep belly breaths and feeling the pelvic floor relax and lower on each inhale.    Slide feet down the wall and move hips slightly away from the wall and then let your knees fall to the sides so you feel a stretch on the inside of the thighs near your groin. Hold while taking 5 deep belly breaths and feeling the pelvic floor relax and lower on each inhale.     *Perform each stretch in sequence 3 times, once a day  

## 2019-03-12 NOTE — Therapy (Signed)
[place] MAIN [facility] SERVICES [address] [place], [place], [address] Phone: [phone]   Fax:  [phone]  Physical Therapy Treatment  The patient has been informed of current processes in place at Outpatient Rehab to protect patients from Covid-19 exposure including social distancing, schedule modifications, and new cleaning procedures. After discussing their particular risk with a therapist based on the patient's personal risk factors, the patient has decided to proceed with in-person therapy.   Patient Details  Name: Anna [name] MRN: [id number] Date of Birth: Mar 05, 1956 Referring Provider (PT): Benjaman Kindler   Encounter Date: 03/12/2019  PT End of Session - 03/16/19 0822    Visit Number  13    Number of Visits  20    Date for PT Re-Evaluation  03/30/19    Authorization Type  medcost    Authorization Time Period  through 02/02/2019    Authorization - Visit Number  3    Authorization - Number of Visits  10    PT Start Time  1630    PT Stop Time  1730    PT Time Calculation (min)  60 min    Activity Tolerance  Patient tolerated treatment well;No increased pain    Behavior During Therapy  WFL for tasks assessed/performed       Past Medical History:  Diagnosis Date  . Hx of squamous cell carcinoma of skin 04/25/2015   L chest parasternal    No past surgical history on file.  There were no vitals filed for this visit.     Pelvic Floor Physical Therapy Treatment Note  SCREENING  Changes in medications, allergies, or medical history?:  none   SUBJECTIVE  Patient reports: She is doing well, got a new mattress and it seems to be helping. Still only having a little pain with initial penetration. R knee has been bothering her  Precautions:  Hx. Of skin CA.  Pain update:  Location of pain: R knee (neck) Current pain:  3/10 (2) Max pain:  4/10 (2) Least pain:  0/10 (2) Nature of pain: dull ache   **just sore  following treatment in knee.  Patient Goals: To be able to have sex without being in pain.   OBJECTIVE  Changes in: Mobility/ROM: ~ 45 degrees of hip ABD B with sharp sensation during stretch.  *following treatment gained ~ 5 deg. B and able to attain good stretch without shaking.  Palpation: Highly TTP through B adductors.  INTERVENTIONS THIS SESSION: Manual: Performed TP release to B adductors to decrease spasm and pain and allow for improved balance of musculature for improved function and decreased symptoms. Dry-needle: Performed TPDN with .30x63mm needle and standard approach to decrease spasm and pain and allow for improved balance of musculature for improved function and decreased symptoms. Therex: Educated on and practiced 3-way wall stretch To maintain and improve muscle length and allow for improved balance of musculature for long-term symptom relief.   Total time: 60 min.                     Trigger Point Dry Needling - 03/16/19 0001    Consent Given?  Yes    Education Handout Provided  No    Muscles Treated Lower Quadrant  Adductor longus/brevis/magnus    Dry Needling Comments  Bilateral    Adductor Response  Twitch response elicited;Palpable increased muscle length             PT Short Term Goals -  02/02/19 1009      PT SHORT TERM GOAL #1   Title  Patient will demonstrate improved pelvic alignment and balance of musculature surrounding the pelvis to facilitate decreased PFM spasms and decrease pelvic pain.    Baseline  Pt. Demos spasms in R adductor and Psoas, R innominate up-slipped As of 7/13: level and no spasms    Time  5    Period  Weeks    Status  Achieved    Target Date  12/29/18      PT SHORT TERM GOAL #2   Title  Patient will demonstrate HEP x1 in the clinic to demonstrate understanding and proper form to allow for further improvement.    Baseline  Pt. lacks knowledge of therapeutic exercises that will decrease her pain.     Time  5    Period  Weeks    Status  Achieved    Target Date  12/29/18      PT SHORT TERM GOAL #3   Title  Patient will report a reduction in pain to no greater than 3/10 over the prior week to demonstrate symptom improvement.    Baseline  6/10 with intercourse, Pt. has not yet tried intercourse, has had neck pain of 4/10 over prior week has only had a HA (4/10) and 3/10 with intercourse over past week    Time  5    Period  Weeks    Status  On-going    Target Date  02/23/19        PT Long Term Goals - 02/02/19 1010      PT LONG TERM GOAL #1   Title  Patient will report no pain with intercourse to demonstrate improved functional ability.    Baseline  Pt. is in 6/10 pain with intercourse. As of 8/10: 3/10 with intercourse (more burning than deep pain)    Time  10    Period  Weeks    Status  On-going    Target Date  03/30/19      PT LONG TERM GOAL #2   Title  Patient will score less than or equal to 15% on the Female NIH-CPSI and 6% on the VQ to demonstrate a reduction in pain, urinary symptoms, and an improved quality of life.    Baseline  VQ: 4/33 (12%), Female NIH-CPSI: 13/43 (30%) as of 8/10: VQ: 5/33 (15%)(pt. reports improvement, likely skewed by under-reporting at initial visit), Female NIH-CPSI: 13/43 (9%)    Time  10    Period  Weeks    Status  On-going    Target Date  03/30/19      PT LONG TERM GOAL #3   Title  Patient will demonstrate appropriate body mechanics with bending and lifting heavy objects to allow for decreased stress on the pelvic floor and low back.     Baseline  Pt demos genu valgus with BW squat, contributing to adductor spasms and PFM spasms.    Time  10    Period  Weeks    Status  Achieved    Target Date  02/02/19            Plan - 03/16/19 [id number]    Clinical Impression Statement  Pt. Responded well to all interventions today, demonstrating improved adductor length, decreased spasm and pain, as well as understanding and correct performance of  all education and exercises provided today. They will continue to benefit from skilled physical therapy to work toward remaining goals and maximize function as well  as decrease likelihood of symptom increase or recurrence.     PT Next Visit Plan  give side-plank and teapot, cervical rotation with overpressure to R? discuss progression, determine if we should discharge/ HEP plan    PT Home Exercise Plan  bow-and-arrow, side-stretch, self TP release and diaphragmatic breathing, piriformis stretch, dilator use, vaginal lubricants and moisturizers, self QL TP release, vit. E/coconut oil suppositories. neck stretches all directions, self TP release to neck/shoulders with ball/cane, chin-tuck, scap retractions, thoracic ext over towel, heel-lift in L shoe, hip ext/ABD, 3 way wall stretch    Consulted and Agree with Plan of Care  Patient       Patient will benefit from skilled therapeutic intervention in order to improve the following deficits and impairments:     Visit Diagnosis: Other muscle spasm  Abnormal posture     Problem List There are no active problems to display for this patient.  Willa Rough DPT, ATC Willa Rough 03/16/2019, 8:25 AM  [place] MAIN [facility] SERVICES [address] [place], [place], [address] Phone: [phone]   Fax:  [phone]  Name: Anna [name] MRN: [id number] Date of Birth: [birth year]

## 2019-03-16 ENCOUNTER — Other Ambulatory Visit: Payer: Self-pay

## 2019-03-16 ENCOUNTER — Ambulatory Visit: Payer: PRIVATE HEALTH INSURANCE

## 2019-03-16 DIAGNOSIS — M62838 Other muscle spasm: Secondary | ICD-10-CM

## 2019-03-16 DIAGNOSIS — R293 Abnormal posture: Secondary | ICD-10-CM

## 2019-03-16 NOTE — Patient Instructions (Signed)
   Left side-down only for a total of 1:30 per day, split into smaller segments as needed for good form.

## 2019-03-17 NOTE — Therapy (Signed)
[place] MAIN [facility] SERVICES [address] [place], [place], [address] Phone: [phone]   Fax:  [phone]  Physical Therapy Treatment The patient has been informed of current processes in place at Outpatient Rehab to protect patients from Covid-19 exposure including social distancing, schedule modifications, and new cleaning procedures. After discussing their particular risk with a therapist based on the patient's personal risk factors, the patient has decided to proceed with in-person therapy.  Patient Details  Name: Anna [name] MRN: [id number] Date of Birth: 05/24/1956 Referring Provider (PT): Benjaman Kindler   Encounter Date: 03/16/2019  PT End of Session - 03/17/19 1318    Visit Number  14    Number of Visits  20    Date for PT Re-Evaluation  03/30/19    Authorization Type  medcost    Authorization Time Period  through 03/30/2019    Authorization - Visit Number  4    Authorization - Number of Visits  10    PT Start Time  1000    PT Stop Time  1100    PT Time Calculation (min)  60 min    Activity Tolerance  Patient tolerated treatment well;No increased pain    Behavior During Therapy  WFL for tasks assessed/performed       Past Medical History:  Diagnosis Date  . Hx of squamous cell carcinoma of skin 04/25/2015   L chest parasternal    No past surgical history on file.  There were no vitals filed for this visit.   Pelvic Floor Physical Therapy Treatment Note  SCREENING  Changes in medications, allergies, or medical history?:  none   SUBJECTIVE  Patient reports: Doing well, still only having pain briefly with initial penetration. Having pain in her head/neck, not as much in the knee.  Precautions:  Hx. Of skin CA.  Pain update:  Location of pain: Neck Current pain:  2/10 Max pain:  2/10 Least pain:  2/10 Nature of pain: dull ache/constant   **less pain following treatment, feels "looser"  Patient  Goals: To be able to have sex without being in pain.   OBJECTIVE  Changes in: Mobility/ROM: ~ 45 degrees of hip ABD B with sharp sensation during stretch.  *following treatment gained ~ 5 deg. B and able to attain good stretch without shaking.  Palpation: Moderate TTP to L adductors, TTP to R sub-occipitals, cervical multifidus and erector spinae.   INTERVENTIONS THIS SESSION: Manual: Performed TP release to L adductors, R sub-occipitals, cervical multifidus and erector spinae to decrease spasm and pain and allow for improved balance of musculature for improved function and decreased symptoms. Dry-needle: Performed TPDN with .30x41mm needle and standard approach to decrease spasm and pain and allow for improved balance of musculature for improved function and decreased symptoms. Therex: Reviewed HEP and went over how long to continue each exercise or when to do it. Educated on and practiced side-plank with L side-down to minimize R lumbar curve.    Total time: 60 min.                           Trigger Point Dry Needling - 03/17/19 0001    Consent Given?  Yes    Education Handout Provided  No    Muscles Treated Head and Neck  Oblique capitus;Suboccipitals;Cervical multifidi    Other Dry Needling  R    Oblique Capitus Response  Twitch response elicited;Palpable increased muscle length  Cervical multifidi Response  Twitch reponse elicited;Palpable increased muscle length             PT Short Term Goals - 02/02/19 1009      PT SHORT TERM GOAL #1   Title  Patient will demonstrate improved pelvic alignment and balance of musculature surrounding the pelvis to facilitate decreased PFM spasms and decrease pelvic pain.    Baseline  Pt. Demos spasms in R adductor and Psoas, R innominate up-slipped As of 7/13: level and no spasms    Time  5    Period  Weeks    Status  Achieved    Target Date  12/29/18      PT SHORT TERM GOAL #2   Title  Patient will  demonstrate HEP x1 in the clinic to demonstrate understanding and proper form to allow for further improvement.    Baseline  Pt. lacks knowledge of therapeutic exercises that will decrease her pain.    Time  5    Period  Weeks    Status  Achieved    Target Date  12/29/18      PT SHORT TERM GOAL #3   Title  Patient will report a reduction in pain to no greater than 3/10 over the prior week to demonstrate symptom improvement.    Baseline  6/10 with intercourse, Pt. has not yet tried intercourse, has had neck pain of 4/10 over prior week has only had a HA (4/10) and 3/10 with intercourse over past week    Time  5    Period  Weeks    Status  On-going    Target Date  02/23/19        PT Long Term Goals - 02/02/19 1010      PT LONG TERM GOAL #1   Title  Patient will report no pain with intercourse to demonstrate improved functional ability.    Baseline  Pt. is in 6/10 pain with intercourse. As of 8/10: 3/10 with intercourse (more burning than deep pain)    Time  10    Period  Weeks    Status  On-going    Target Date  03/30/19      PT LONG TERM GOAL #2   Title  Patient will score less than or equal to 15% on the Female NIH-CPSI and 6% on the VQ to demonstrate a reduction in pain, urinary symptoms, and an improved quality of life.    Baseline  VQ: 4/33 (12%), Female NIH-CPSI: 13/43 (30%) as of 8/10: VQ: 5/33 (15%)(pt. reports improvement, likely skewed by under-reporting at initial visit), Female NIH-CPSI: 13/43 (9%)    Time  10    Period  Weeks    Status  On-going    Target Date  03/30/19      PT LONG TERM GOAL #3   Title  Patient will demonstrate appropriate body mechanics with bending and lifting heavy objects to allow for decreased stress on the pelvic floor and low back.     Baseline  Pt demos genu valgus with BW squat, contributing to adductor spasms and PFM spasms.    Time  10    Period  Weeks    Status  Achieved    Target Date  02/02/19            Plan - 03/17/19  1336    Clinical Impression Statement  Pt. Responded well to all interventions today, demonstrating improved cervical alignement and decreased pain as well as understanding and correct performance  of all education and exercises provided today. They will continue to benefit from skilled physical therapy to work toward remaining goals and maximize function as well as decrease likelihood of symptom increase or recurrence.     PT Next Visit Plan  review side-plank and give teapot with band, cervical rotation with overpressure to R? review goals and re-cert for scoliosis/neck pain Vs. D/C    PT Home Exercise Plan  bow-and-arrow, side-stretch, self TP release and diaphragmatic breathing, piriformis stretch, dilator use, vaginal lubricants and moisturizers, self QL TP release, vit. E/coconut oil suppositories. neck stretches all directions, self TP release to neck/shoulders with ball/cane, chin-tuck, scap retractions, thoracic ext over towel, heel-lift in L shoe, hip ext/ABD, 3 way wall stretch, side-plank    Consulted and Agree with Plan of Care  Patient       Patient will benefit from skilled therapeutic intervention in order to improve the following deficits and impairments:     Visit Diagnosis: Other muscle spasm  Abnormal posture     Problem List There are no active problems to display for this patient.  Willa Rough DPT, ATC Willa Rough 03/17/2019, 1:38 PM  [place] MAIN [facility] SERVICES [address] [place], [place], [address] Phone: [phone]   Fax:  [phone]  Name: Anna [name] MRN: [id number] Date of Birth: October 14, 1955

## 2019-03-30 ENCOUNTER — Ambulatory Visit: Payer: PRIVATE HEALTH INSURANCE | Attending: Obstetrics and Gynecology

## 2019-03-30 ENCOUNTER — Other Ambulatory Visit: Payer: Self-pay

## 2019-03-30 DIAGNOSIS — R293 Abnormal posture: Secondary | ICD-10-CM | POA: Diagnosis present

## 2019-03-30 DIAGNOSIS — M62838 Other muscle spasm: Secondary | ICD-10-CM | POA: Diagnosis present

## 2019-03-30 NOTE — Therapy (Signed)
[place] MAIN [facility] SERVICES [address] [place], [place], [address] Phone: [phone]   Fax:  [phone]  Physical Therapy Treatment  The patient has been informed of current processes in place at Outpatient Rehab to protect patients from Covid-19 exposure including social distancing, schedule modifications, and new cleaning procedures. After discussing their particular risk with a therapist based on the patient's personal risk factors, the patient has decided to proceed with in-person therapy.   Patient Details  Name: Anna [name] MRN: [id number] Date of Birth: August 14, 1955 Referring Provider (PT): Benjaman Kindler   Encounter Date: 03/30/2019  PT End of Session - 03/30/19 1445    Visit Number  15    Number of Visits  20    Date for PT Re-Evaluation  03/30/19    Authorization Type  medcost    Authorization Time Period  through 03/30/2019    Authorization - Visit Number  5    Authorization - Number of Visits  10    PT Start Time  1100    PT Stop Time  1200    PT Time Calculation (min)  60 min    Activity Tolerance  Patient tolerated treatment well;No increased pain    Behavior During Therapy  WFL for tasks assessed/performed       Past Medical History:  Diagnosis Date  . Hx of squamous cell carcinoma of skin 04/25/2015   L chest parasternal    No past surgical history on file.  There were no vitals filed for this visit.    Pelvic Floor Physical Therapy Treatment Note  SCREENING  Changes in medications, allergies, or medical history?:  none   SUBJECTIVE  Patient reports: Her head/neck was doing better after the last time but it was aggravated by lifting her grandchild up repeatedly and decreased back to 2/10 following.    Precautions:  Hx. Of skin CA.  Pain update:  Location of pain: Neck Current pain:  2/10 Max pain:  4/10 (after lifting her grandkid up) Least pain:  1/10 Nature of pain: dull ache/constant    **less pain following treatment, feels "looser"  Patient Goals: To be able to have sex without being in pain.   OBJECTIVE  Changes in: Mobility/ROM: Decreased R>L cervical rotation with tenderness at end range   Palpation: Moderate TTP to L adductors, TTP to R sub-occipitals, cervical multifidus and erector spinae.   INTERVENTIONS THIS SESSION: Manual: Performed TP release to L adductors, R sub-occipitals, cervical multifidus and erector spinae to decrease spasm and pain and allow for improved balance of musculature for improved function and decreased symptoms. Dry-needle: Performed TPDN with .30x53m needle and standard approach to decrease spasm and pain and allow for improved balance of musculature for improved function and decreased symptoms. Therex: Reviewed HEP and went over how long to continue each exercise or when to do it. Reviewed side-plank with L side-down, Educated on and practiced tea-pot with green band to address the upper half of her scoliotic curve. Educated on and practiced cervical rotation with overpressure to the R and resisted L cervical rotation to improve cervical mal-alignment and decrease pain.     Total time: 60 min.                           PT Short Term Goals - 03/30/19 1121      PT SHORT TERM GOAL #1   Title  Patient will demonstrate improved pelvic alignment and  balance of musculature surrounding the pelvis to facilitate decreased PFM spasms and decrease pelvic pain.    Baseline  Pt. Demos spasms in R adductor and Psoas, R innominate up-slipped As of 7/13: level and no spasms    Time  5    Period  Weeks    Status  Achieved    Target Date  12/29/18      PT SHORT TERM GOAL #2   Title  Patient will demonstrate HEP x1 in the clinic to demonstrate understanding and proper form to allow for further improvement.    Baseline  Pt. lacks knowledge of therapeutic exercises that will decrease her pain.    Time  5    Period   Weeks    Status  Achieved    Target Date  12/29/18      PT SHORT TERM GOAL #3   Title  Patient will report a reduction in pain to no greater than 3/10 over the prior week to demonstrate symptom improvement.    Baseline  6/10 with intercourse, Pt. has not yet tried intercourse, has had neck pain of 4/10 over prior week has only had a HA (4/10) and 3/10 with intercourse over past week    Time  5    Period  Weeks    Status  Partially Met    Target Date  02/23/19        PT Long Term Goals - 03/30/19 1119      PT LONG TERM GOAL #1   Title  Patient will report no pain with intercourse to demonstrate improved functional ability.    Baseline  Pt. is in 6/10 pain with intercourse. As of 8/10: 3/10 with intercourse (more burning than deep pain) As of 10/5: 2/10 with initial penetration and reports it continues to improve with HEP.    Time  10    Period  Weeks    Status  Partially Met    Target Date  03/30/19      PT LONG TERM GOAL #2   Title  Patient will score less than or equal to 15% on the Female NIH-CPSI and 6% on the VQ to demonstrate a reduction in pain, urinary symptoms, and an improved quality of life.    Baseline  VQ: 4/33 (12%), Female NIH-CPSI: 13/43 (30%) as of 8/10: VQ: 5/33 (15%)(pt. reports improvement, likely skewed by under-reporting at initial visit), Female NIH-CPSI: 13/43 (9%) As of 10/5: Female NIH:4/43 (9%)  VQ: 2/33 (6%)    Time  10    Period  Weeks    Status  On-going    Target Date  03/30/19      PT LONG TERM GOAL #3   Title  Patient will demonstrate appropriate body mechanics with bending and lifting heavy objects to allow for decreased stress on the pelvic floor and low back.     Baseline  Pt demos genu valgus with BW squat, contributing to adductor spasms and PFM spasms.    Time  10    Period  Weeks    Status  Achieved    Target Date  02/02/19            Plan - 03/30/19 1447    Clinical Impression Statement  Pt. Has demonstrated significant  improvement, meeting or making great progress toward all goals. She continues to have 2/10 pain with initial penetration during intercourse but feels that this continues to improve through her HEP and with her lichen sclerosis she will likely have some  amount of discomfort indefinately. She has some neck/head pain which has been partially addressed and likely is a result of her scoliosis. She may benefit from outpatient PT to specifically address scoliosis/neck pain. We will D/C at this time to HEP.    Personal Factors and Comorbidities  Comorbidity 1    Comorbidities  Lichen Sclerosis    Examination-Participation Restrictions  Interpersonal Relationship    Rehab Potential  Good    PT Frequency  --   D/C   PT Duration  --   D/C   PT Next Visit Plan  D/C    PT Home Exercise Plan  bow-and-arrow, side-stretch, self TP release and diaphragmatic breathing, piriformis stretch, dilator use, vaginal lubricants and moisturizers, self QL TP release, vit. E/coconut oil suppositories. neck stretches all directions, self TP release to neck/shoulders with ball/cane, chin-tuck, scap retractions, thoracic ext over towel, heel-lift in L shoe, hip ext/ABD, 3 way wall stretch, side-plank, AAROM into R rotation and resisted L rotation for cervical spine.    Consulted and Agree with Plan of Care  Patient       Patient will benefit from skilled therapeutic intervention in order to improve the following deficits and impairments:  Pain, Postural dysfunction, Increased muscle spasms, Decreased scar mobility, Decreased mobility, Decreased coordination, Decreased range of motion  Visit Diagnosis: Other muscle spasm  Abnormal posture     Problem List There are no active problems to display for this patient.  Willa Rough DPT, ATC Willa Rough 03/30/2019, 3:04 PM  [place] MAIN [facility] SERVICES [address] [place], [place], [address] Phone: [phone]   Fax:   [phone]  Name: Anna [name] MRN: [id number] Date of Birth: Jul 02, 1955

## 2019-03-30 NOTE — Patient Instructions (Addendum)
Rotation: Resisted - Mid to End Range (Supine)    With head in center, applying gentle resistance at LEFT  temple, turn head to that side. Repeat __10__ times per set. Do __2__ sets per session. Do __1__ sessions per day.   AROM: Neck Rotation   (use two fingers to gently apply overpressure as you rotate)  Turn head slowly to look over RIGHT shoulder, then the other. Hold each position __1__ seconds. Repeat __10__ times per set. Do __2__ sets per session. Do __1__ sessions per day.  Do both of these for ~ 1 month to improve cervical mobility and alignment. If you have even range of motion to both sides at one month and your pain is decreased you can discontinue. If you are still having pain but it is less, try for one more month. If at the end of two months you still are having pain at rest, go seek further PT. Check into [facility] in [place] and ask for Wes.    ** See video for "tea-pot exercise"   Do the Tea-pot, bow-and-arrow, side-stretch, and side-plank forever (at least 3 times per week, ideally 5-7) on-going (forever)  You can use the other exercises and techniques for another 1-2 months regularly and then decrease or discontinue as you desire.

## 2019-12-03 ENCOUNTER — Other Ambulatory Visit: Payer: Self-pay | Admitting: Obstetrics and Gynecology

## 2019-12-03 DIAGNOSIS — Z1231 Encounter for screening mammogram for malignant neoplasm of breast: Secondary | ICD-10-CM

## 2019-12-29 ENCOUNTER — Ambulatory Visit
Admission: RE | Admit: 2019-12-29 | Discharge: 2019-12-29 | Disposition: A | Discharge status: Home / Self Care | Payer: BC Managed Care – PPO | Source: Ambulatory Visit | Attending: Obstetrics and Gynecology | Admitting: Obstetrics and Gynecology

## 2019-12-29 DIAGNOSIS — Z1231 Encounter for screening mammogram for malignant neoplasm of breast: Secondary | ICD-10-CM | POA: Diagnosis present

## 2020-05-02 ENCOUNTER — Other Ambulatory Visit: Payer: Self-pay

## 2020-05-02 ENCOUNTER — Ambulatory Visit: Payer: BC Managed Care – PPO | Admitting: Dermatology

## 2020-05-02 ENCOUNTER — Encounter: Payer: Self-pay | Admitting: Dermatology

## 2020-05-02 DIAGNOSIS — L820 Inflamed seborrheic keratosis: Secondary | ICD-10-CM | POA: Diagnosis not present

## 2020-05-02 DIAGNOSIS — L900 Lichen sclerosus et atrophicus: Secondary | ICD-10-CM | POA: Diagnosis not present

## 2020-05-02 DIAGNOSIS — L609 Nail disorder, unspecified: Secondary | ICD-10-CM | POA: Diagnosis not present

## 2020-05-02 DIAGNOSIS — L814 Other melanin hyperpigmentation: Secondary | ICD-10-CM

## 2020-05-02 DIAGNOSIS — L821 Other seborrheic keratosis: Secondary | ICD-10-CM

## 2020-05-02 DIAGNOSIS — Q825 Congenital non-neoplastic nevus: Secondary | ICD-10-CM

## 2020-05-02 DIAGNOSIS — D1800 Hemangioma unspecified site: Secondary | ICD-10-CM

## 2020-05-02 DIAGNOSIS — Z1283 Encounter for screening for malignant neoplasm of skin: Secondary | ICD-10-CM

## 2020-05-02 DIAGNOSIS — Z85828 Personal history of other malignant neoplasm of skin: Secondary | ICD-10-CM

## 2020-05-02 DIAGNOSIS — D229 Melanocytic nevi, unspecified: Secondary | ICD-10-CM

## 2020-05-02 DIAGNOSIS — L578 Other skin changes due to chronic exposure to nonionizing radiation: Secondary | ICD-10-CM

## 2020-05-02 NOTE — Progress Notes (Addendum)
Follow-Up Visit   Subjective  Anna [name] is a 64 y.o. female who presents for the following: Annual Exam (Hx SCC - patient has noticed a pink lesion on her R chest, and another lesion on her L shoulder that she would like checked). The patient presents for Total-Body Skin Exam (TBSE) for skin cancer screening and mole check.  The following portions of the chart were reviewed this encounter and updated as appropriate:  Tobacco  Allergies  Meds  Problems  Med Hx  Surg Hx  Fam Hx     Review of Systems:  No other skin or systemic complaints except as noted in HPI or Assessment and Plan.  Objective  Well appearing patient in no apparent distress; mood and affect are within normal limits.  A full examination was performed including scalp, head, eyes, ears, nose, lips, neck, chest, axillae, abdomen, back, buttocks, bilateral upper extremities, bilateral lower extremities, hands, feet, fingers, toes, fingernails, and toenails. All findings within normal limits unless otherwise noted below.  Objective  R elbow x 1, R clavicle x 1, L chest x 1, L bicep x 1 (4): Erythematous keratotic or waxy stuck-on papule or plaque.   Objective  Back: Vascular macule   Objective  R thumb nail: Nail dystrophy   Assessment & Plan  Lichen sclerosus et atrophicus Underwear area Currently being treated by her GYN Dr. Leafy Ro -  Continue Clobetasol ointment as prescribed.  Inflamed seborrheic keratosis (4) R elbow x 1, R clavicle x 1, L chest x 1, L bicep x 1  Destruction of lesion - R elbow x 1, R clavicle x 1, L chest x 1, L bicep x 1 Complexity: simple   Destruction method: cryotherapy   Informed consent: discussed and consent obtained   Timeout:  patient name, date of birth, surgical site, and procedure verified Lesion destroyed using liquid nitrogen: Yes   Region frozen until ice ball extended beyond lesion: Yes   Outcome: patient tolerated procedure well with no complications     Post-procedure details: wound care instructions given    Vascular birthmark Back Benign appearing, observe.  Nail problem R thumb nail Nail dystrophy Due to trauma - may take longer than 6 months to grow back, and may never be completely normal depending on severity of damage.    Lentigines - Scattered tan macules - Discussed due to sun exposure - Benign, observe - Call for any changes  Seborrheic Keratoses - Stuck-on, waxy, tan-brown papules and plaques  - Discussed benign etiology and prognosis. - Observe - Call for any changes  Melanocytic Nevi - Tan-brown and/or pink-flesh-colored symmetric macules and papules - Benign appearing on exam today - Observation - Call clinic for new or changing moles - Recommend daily use of broad spectrum spf 30+ sunscreen to sun-exposed areas.   Hemangiomas - Red papules - Discussed benign nature - Observe - Call for any changes  Actinic Damage - Chronic, secondary to cumulative UV/sun exposure - diffuse scaly erythematous macules with underlying dyspigmentation - Recommend daily broad spectrum sunscreen SPF 30+ to sun-exposed areas, reapply every 2 hours as needed.  - Call for new or changing lesions.  History of Squamous Cell Carcinoma of the Skin - No evidence of recurrence today - No lymphadenopathy - Recommend regular full body skin exams - Recommend daily broad spectrum sunscreen SPF 30+ to sun-exposed areas, reapply every 2 hours as needed.  - Call if any new or changing lesions are noted between office visits  Skin cancer  screening performed today.  Return in about 1 year (around 05/02/2021) for TBSE - hx of SCC .  Luther Redo, CMA, am acting as scribe for Sarina Ser, MD .  Documentation: I have reviewed the above documentation for accuracy and completeness, and I agree with the above.  Sarina Ser, MD

## 2020-12-06 ENCOUNTER — Other Ambulatory Visit: Payer: Self-pay | Admitting: Obstetrics and Gynecology

## 2020-12-06 DIAGNOSIS — Z1231 Encounter for screening mammogram for malignant neoplasm of breast: Secondary | ICD-10-CM

## 2020-12-29 ENCOUNTER — Other Ambulatory Visit: Payer: Self-pay

## 2020-12-29 ENCOUNTER — Ambulatory Visit
Admission: RE | Admit: 2020-12-29 | Discharge: 2020-12-29 | Disposition: A | Discharge status: Home / Self Care | Payer: BC Managed Care – PPO | Source: Ambulatory Visit | Attending: Obstetrics and Gynecology | Admitting: Obstetrics and Gynecology

## 2020-12-29 DIAGNOSIS — Z1231 Encounter for screening mammogram for malignant neoplasm of breast: Secondary | ICD-10-CM | POA: Insufficient documentation

## 2021-03-22 IMAGING — MG DIGITAL SCREENING BILATERAL MAMMOGRAM WITH TOMO AND CAD
8 series · 8 of 24 positions shown · non-contrast
Comparison: Previous exam(s).

CLINICAL DATA: Screening.

EXAM:
DIGITAL SCREENING BILATERAL MAMMOGRAM WITH TOMO AND CAD

[R MLO synth-2D]
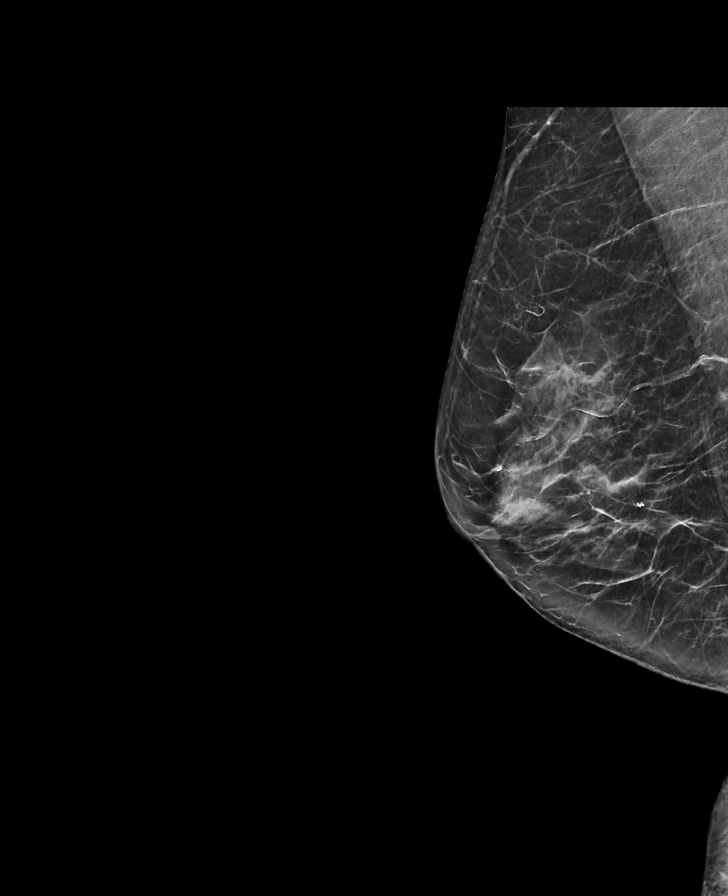

[L CC synth-2D]
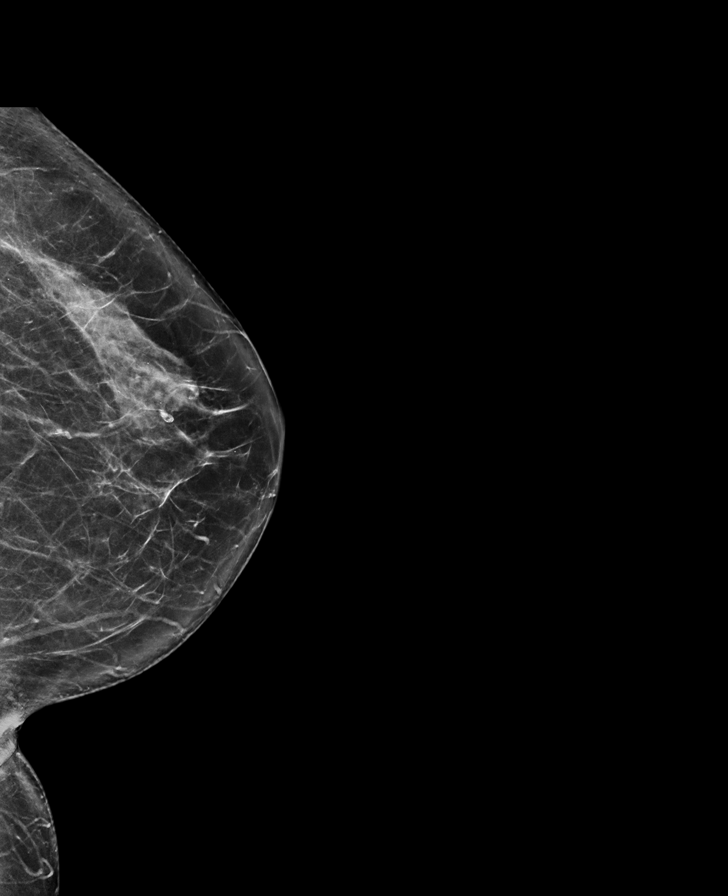

[L MLO synth-2D]
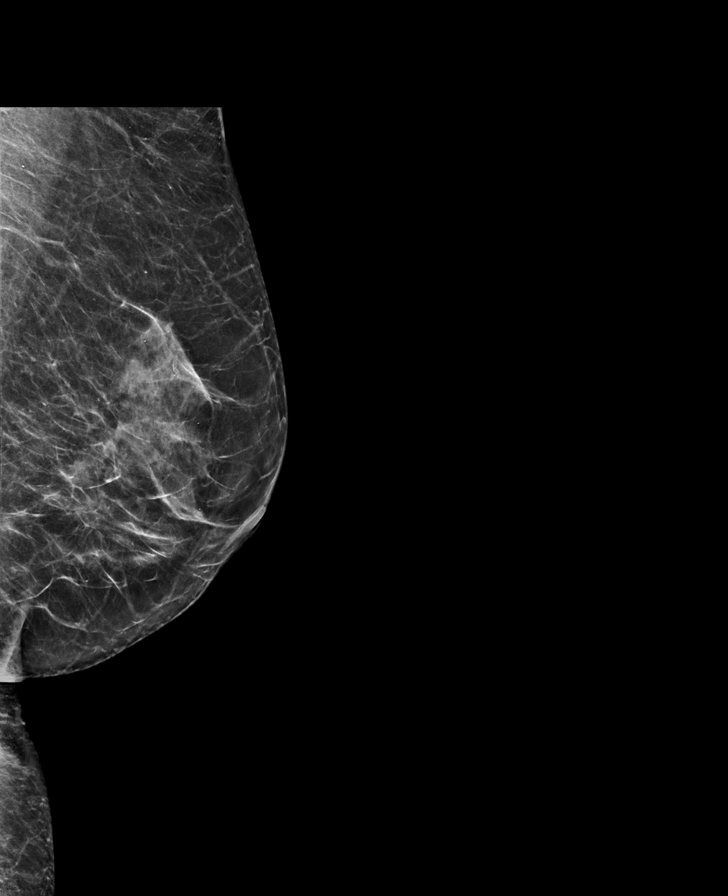

[R CC synth-2D]
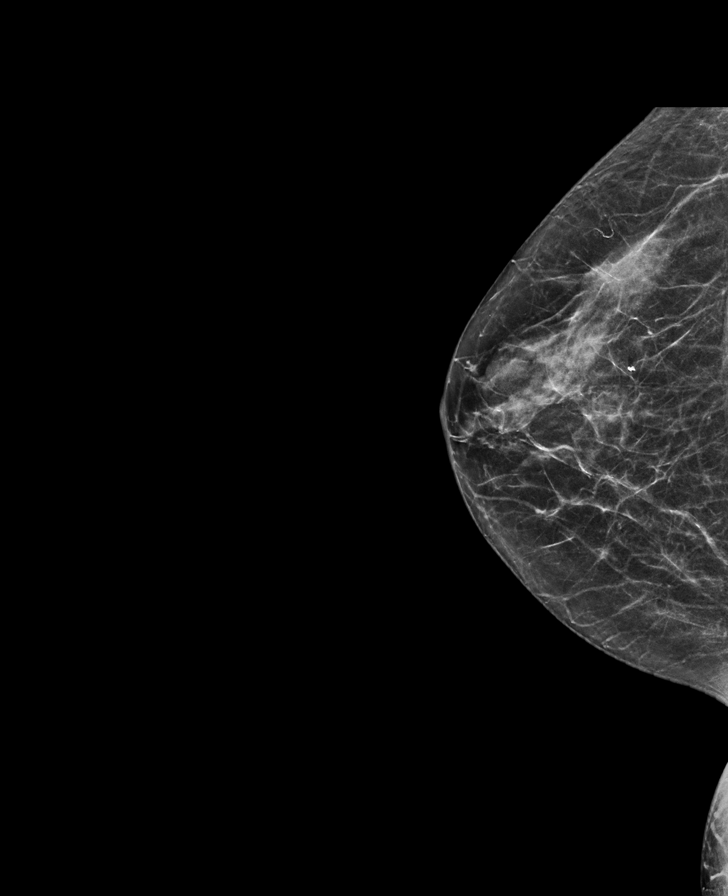

[L MLO tomo · tomo slice 32/63.0]
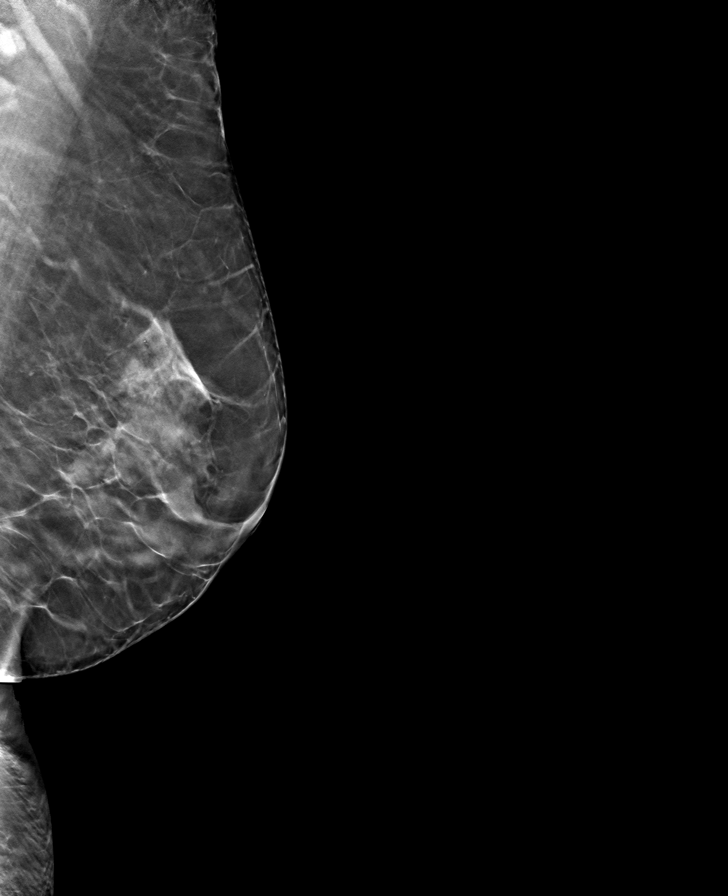

[R MLO tomo · tomo slice 35/70.0]
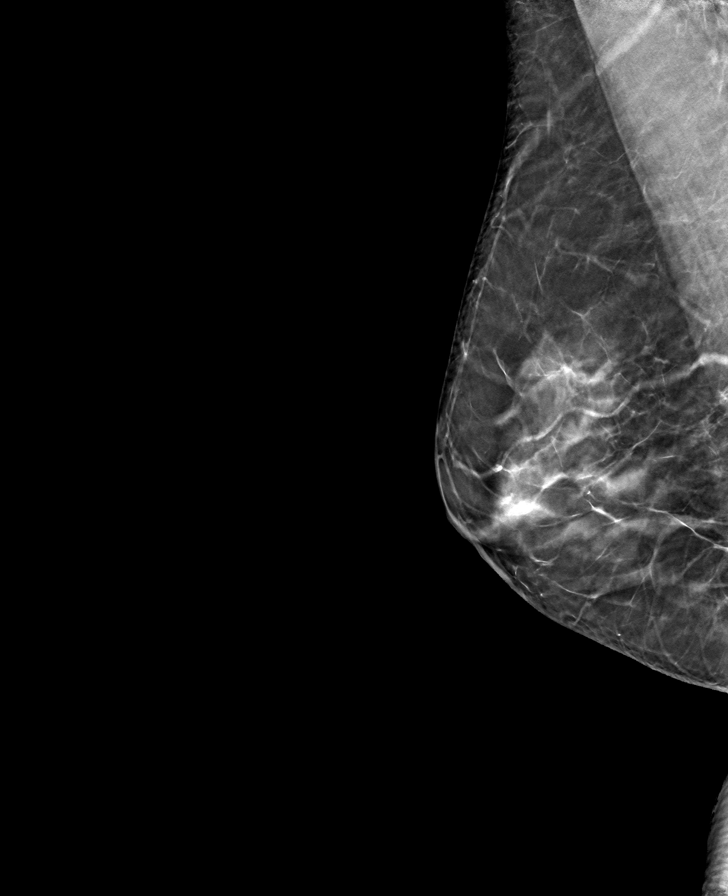

[R CC tomo · tomo slice 28/55.0]
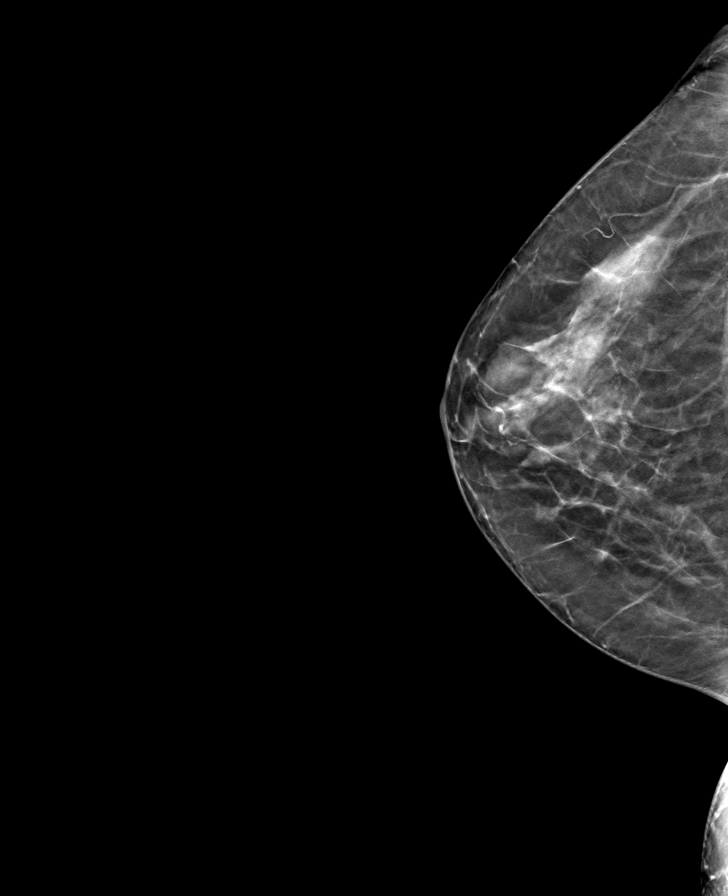

[L CC tomo · tomo slice 36/71.0]
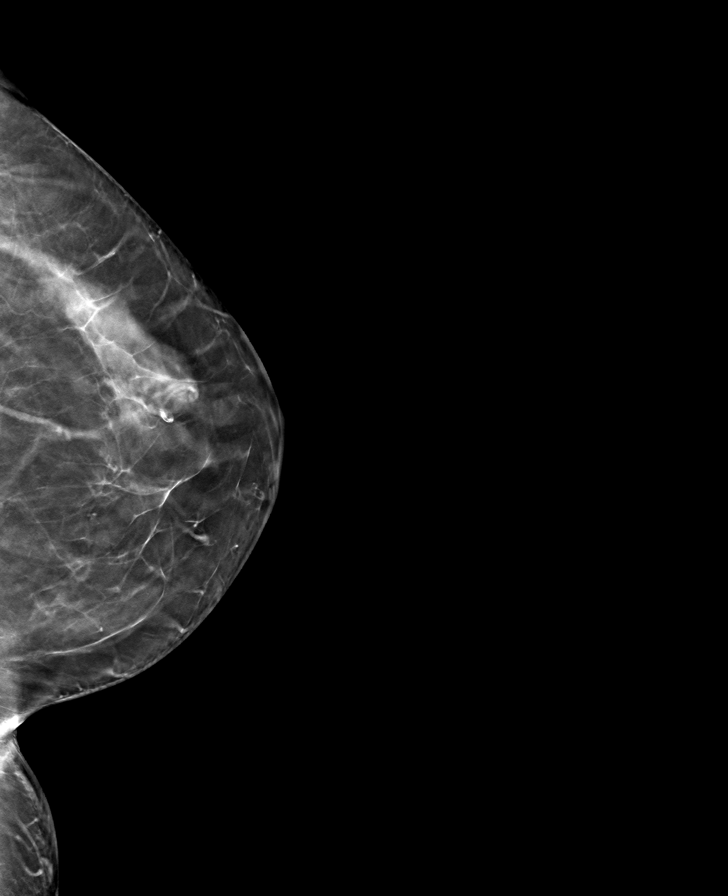

[8 of 24 positions shown; findings below may reference images not displayed]

ACR Breast Density Category c: The breast tissue is heterogeneously
dense, which may obscure small masses.
FINDINGS: There are no findings suspicious for malignancy. Images were
processed with CAD.
IMPRESSION: No mammographic evidence of malignancy. A result letter of this
screening mammogram will be mailed directly to the patient.

RECOMMENDATION:
Screening mammogram in one year. (Code:[id number]-U-LHB)

BI-RADS CATEGORY  1: Negative.

## 2021-05-08 ENCOUNTER — Ambulatory Visit (INDEPENDENT_AMBULATORY_CARE_PROVIDER_SITE_OTHER): Payer: BC Managed Care – PPO | Admitting: Dermatology

## 2021-05-08 ENCOUNTER — Other Ambulatory Visit: Payer: Self-pay

## 2021-05-08 DIAGNOSIS — L738 Other specified follicular disorders: Secondary | ICD-10-CM

## 2021-05-08 DIAGNOSIS — L820 Inflamed seborrheic keratosis: Secondary | ICD-10-CM | POA: Diagnosis not present

## 2021-05-08 DIAGNOSIS — L578 Other skin changes due to chronic exposure to nonionizing radiation: Secondary | ICD-10-CM

## 2021-05-08 DIAGNOSIS — Q825 Congenital non-neoplastic nevus: Secondary | ICD-10-CM | POA: Diagnosis not present

## 2021-05-08 DIAGNOSIS — Z1283 Encounter for screening for malignant neoplasm of skin: Secondary | ICD-10-CM

## 2021-05-08 DIAGNOSIS — D229 Melanocytic nevi, unspecified: Secondary | ICD-10-CM

## 2021-05-08 DIAGNOSIS — L720 Epidermal cyst: Secondary | ICD-10-CM

## 2021-05-08 DIAGNOSIS — L814 Other melanin hyperpigmentation: Secondary | ICD-10-CM

## 2021-05-08 DIAGNOSIS — D1800 Hemangioma unspecified site: Secondary | ICD-10-CM

## 2021-05-08 DIAGNOSIS — L821 Other seborrheic keratosis: Secondary | ICD-10-CM

## 2021-05-08 NOTE — Progress Notes (Signed)
   Follow-Up Visit   Subjective  Anna [name] is a 65 y.o. female who presents for the following: Annual Exam (Hx of SCC - TBSE today). The patient presents for Total-Body Skin Exam (TBSE) for skin cancer screening and mole check.  The following portions of the chart were reviewed this encounter and updated as appropriate:   Tobacco  Allergies  Meds  Problems  Med Hx  Surg Hx  Fam Hx     Review of Systems:  No other skin or systemic complaints except as noted in HPI or Assessment and Plan.  Objective  Well appearing patient in no apparent distress; mood and affect are within normal limits.  A full examination was performed including scalp, head, eyes, ears, nose, lips, neck, chest, axillae, abdomen, back, buttocks, bilateral upper extremities, bilateral lower extremities, hands, feet, fingers, toes, fingernails, and toenails. All findings within normal limits unless otherwise noted below.  Mid Back Erythematous keratotic or waxy stuck-on papule or plaque.   Face Smooth white papule(s).   Face Yellow papules  Left mid back Vascular macule   Assessment & Plan   Lentigines - Scattered tan macules - Due to sun exposure - Benign-appearing, observe - Recommend daily broad spectrum sunscreen SPF 30+ to sun-exposed areas, reapply every 2 hours as needed. - Call for any changes  Seborrheic Keratoses - Stuck-on, waxy, tan-brown papules and/or plaques  - Benign-appearing - Discussed benign etiology and prognosis. - Observe - Call for any changes  Melanocytic Nevi - Tan-brown and/or pink-flesh-colored symmetric macules and papules - Benign appearing on exam today - Observation - Call clinic for new or changing moles - Recommend daily use of broad spectrum spf 30+ sunscreen to sun-exposed areas.   Hemangiomas - Red papules - Discussed benign nature - Observe - Call for any changes  Actinic Damage - Chronic condition, secondary to cumulative UV/sun  exposure - diffuse scaly erythematous macules with underlying dyspigmentation - Recommend daily broad spectrum sunscreen SPF 30+ to sun-exposed areas, reapply every 2 hours as needed.  - Staying in the shade or wearing long sleeves, sun glasses (UVA+UVB protection) and wide brim hats (4-inch brim around the entire circumference of the hat) are also recommended for sun protection.  - Call for new or changing lesions.  Skin cancer screening performed today.  Inflamed seborrheic keratosis Mid Back Destruction of lesion - Mid Back Complexity: simple   Destruction method: cryotherapy   Informed consent: discussed and consent obtained   Timeout:  patient name, date of birth, surgical site, and procedure verified Lesion destroyed using liquid nitrogen: Yes   Region frozen until ice ball extended beyond lesion: Yes   Outcome: patient tolerated procedure well with no complications   Post-procedure details: wound care instructions given    Milia Face Benign, observe  Sebaceous hyperplasia Face Benign, observe  Vascular birthmark Left mid back Benign, observe  Skin cancer screening  Return in about 1 year (around 05/08/2022) for TBSE.  I, Ashok Cordia, CMA, am acting as scribe for Sarina Ser, MD . Documentation: I have reviewed the above documentation for accuracy and completeness, and I agree with the above.  Sarina Ser, MD

## 2021-05-08 NOTE — Patient Instructions (Signed)

## 2021-05-14 ENCOUNTER — Encounter: Payer: Self-pay | Admitting: Dermatology

## 2021-10-10 NOTE — Progress Notes (Signed)
?Charlann Boxer D.O. ?[facility] ?[place] ?Phone: [phone] ?Subjective:   ?I, Jacqualin Combes, am serving as a scribe for Dr. Hulan Saas. ? ?This visit occurred during the SARS-CoV-2 public health emergency.  Safety protocols were in place, including screening questions prior to the visit, additional usage of staff PPE, and extensive cleaning of exam room while observing appropriate contact time as indicated for disinfecting solutions.  ? ? ?I'm seeing this patient by the request  of:  Rusty Aus, MD ? ?CC: neck and shoulder pain  ? ?[id number]  ?Anna [name] is a 66 y.o. female coming in with complaint of cervical spine pain. Patient states that she sees Retia Passe for massages. Pain is chronic and is worsening. New symptoms are pain that radiates down her arm. Having hard time sleeping. Also has pain between scapula. Takes IBU prn.  ? ?  ? ?Past Medical History:  ?Diagnosis Date  ? Hx of squamous cell carcinoma of skin 04/25/2015  ? L chest parasternal  ? ?No past surgical history on file. ?Social History  ? ?Socioeconomic History  ? Marital status: Married  ?  Spouse name: Not on file  ? Number of children: Not on file  ? Years of education: Not on file  ? Highest education level: Not on file  ?Occupational History  ? Not on file  ?Tobacco Use  ? Smoking status: Never  ? Smokeless tobacco: Never  ?Substance and Sexual Activity  ? Alcohol use: Not on file  ? Drug use: Never  ? Sexual activity: Not on file  ?Other Topics Concern  ? Not on file  ?Social History Narrative  ? Not on file  ? ?Social Determinants of Health  ? ?Financial Resource Strain: Not on file  ?Food Insecurity: Not on file  ?Transportation Needs: Not on file  ?Physical Activity: Not on file  ?Stress: Not on file  ?Social Connections: Not on file  ? ?Allergies  ?Allergen Reactions  ? Ciprofloxacin Rash  ? ?Family History  ?Problem Relation Age of Onset  ? Breast cancer Neg Hx    ? ? ?Current Outpatient Medications (Endocrine & Metabolic):  ?  alendronate (FOSAMAX) 70 MG tablet, Take by mouth. ? ? ? ? ? ?Current Outpatient Medications (Other):  ?  calcium acetate (PHOSLO) 667 MG capsule, Take by mouth 1 day or 1 dose. ?  cholecalciferol (VITAMIN D3) 25 MCG (1000 UT) tablet, Take 1,000 Units by mouth daily. ?  citalopram (CELEXA) 20 MG tablet, Take 20 mg by mouth daily. ? ? ?Reviewed prior external information including notes and imaging from  ?primary care provider ?As well as notes that were available from care everywhere and other healthcare systems. ? ?Past medical history, social, surgical and family history all reviewed in electronic medical record.  No pertanent information unless stated regarding to the chief complaint.  ? ?Review of Systems: ? No headache, visual changes, nausea, vomiting, diarrhea, constipation, dizziness, abdominal pain, skin rash, fevers, chills, night sweats, weight loss, swollen lymph nodes, body aches, joint swelling, chest pain, shortness of breath, mood changes. POSITIVE muscle aches ? ?Objective  ?Blood pressure 122/82, pulse 83, height '5\' 7"'$  (1.702 m), weight 144 lb (65.3 kg), SpO2 93 %. ?  ?General: No apparent distress alert and oriented x3 mood and affect normal, dressed appropriately.  ?HEENT: Pupils equal, extraocular movements intact  ?Respiratory: Patient's speak in full sentences and does not appear short of breath  ?Cardiovascular: No lower extremity edema,  non tender, no erythema  ?Gait normal with good balance and coordination.  ?MSK: Neck exam does have some very mild loss of lordosis but does have good range of motion noted.  Negative Spurling's noted.  Does have some scapular dyskinesis right greater than left.  5 out of 5 strength of the upper extremities ? ?Osteopathic findings ?C2 flexed rotated and side bent right ?C4 flexed rotated and side bent left ?C6 flexed rotated and side bent right ?T3 extended rotated and side bent right inhaled  third rib ?T9 extended rotated and side bent left ? ?[id number]; 15 additional minutes spent for Therapeutic exercises as stated in above notes.  This included exercises focusing on stretching, strengthening, with significant focus on eccentric aspects.   Long term goals include an improvement in range of motion, strength, endurance as well as avoiding reinjury. Patient's frequency would include in 1-2 times a day, 3-5 times a week for a duration of 6-12 weeks. Exercises that included:  ?Basic scapular stabilization to include adduction and depression of scapula ?Scaption, focusing on proper movement and good control ?Internal and External rotation utilizing a theraband, with elbow tucked at side entire time ?Rows with theraband  ? Proper technique shown and discussed handout in great detail with ATC.  All questions were discussed and answered.  ? ? ?  ?Impression and Recommendations:  ?  ?The above documentation has been reviewed and is accurate and complete Lyndal Pulley, DO ? ? ? ?

## 2021-10-12 ENCOUNTER — Ambulatory Visit: Payer: BC Managed Care – PPO | Admitting: Family Medicine

## 2021-10-12 ENCOUNTER — Ambulatory Visit (INDEPENDENT_AMBULATORY_CARE_PROVIDER_SITE_OTHER): Payer: BC Managed Care – PPO

## 2021-10-12 VITALS — BP 122/82 | HR 83 | Ht 67.0 in | Wt 144.0 lb

## 2021-10-12 DIAGNOSIS — M9902 Segmental and somatic dysfunction of thoracic region: Secondary | ICD-10-CM | POA: Diagnosis not present

## 2021-10-12 DIAGNOSIS — M542 Cervicalgia: Secondary | ICD-10-CM | POA: Diagnosis not present

## 2021-10-12 DIAGNOSIS — M9908 Segmental and somatic dysfunction of rib cage: Secondary | ICD-10-CM

## 2021-10-12 DIAGNOSIS — M9901 Segmental and somatic dysfunction of cervical region: Secondary | ICD-10-CM | POA: Diagnosis not present

## 2021-10-12 NOTE — Assessment & Plan Note (Signed)
Patient does have neck pain.  We will get x-rays to further evaluate for any bony abnormalities that could be contributing.  No significant radicular symptoms at this time but patient has had them intermittently and we will monitor.  Worsening pain will consider the possibility of gabapentin.  Patient did respond extremely well though to osteopathic manipulation today.  We discussed which activities to do which wants to avoid, increase activity slowly.  Follow-up again in 6 to 8 weeks ?

## 2021-10-12 NOTE — Patient Instructions (Signed)
Good to see you  ?Xray on the way out.  ?Keep hands within peripheral vision when you can  ?Vitamin D 2000 IU daily  ?Tart cherry extract '1200mg'$  nightly  ?See me again in 6 weeks  ?

## 2021-10-12 NOTE — Assessment & Plan Note (Signed)
   Decision today to treat with OMT was based on Physical Exam  After verbal consent patient was treated with HVLA, ME, FPR techniques in cervical, thoracic, rib,areas, all areas are chronic   Patient tolerated the procedure well with improvement in symptoms  Patient given exercises, stretches and lifestyle modifications  See medications in patient instructions if given  Patient will follow up in 4-8 weeks 

## 2021-11-22 NOTE — Progress Notes (Unsigned)
  [facility] [address] [place] Phone: [phone] Subjective:   [name], am serving as a scribe for Dr. Hulan Saas.  I'm seeing this patient by the request  of:  Rusty Aus, MD  CC: Neck pain follow-up  [id number]  Anna [name] is a 66 y.o. female coming in with complaint of back and neck pain. OMT 10/12/2021. Patient states doing better. Shoulder ER exercise hurts. Here for manipulation. No new complaints.  Medications patient has been prescribed: None  Taking:         Reviewed prior external information including notes and imaging from previsou exam, outside providers and external EMR if available.   As well as notes that were available from care everywhere and other healthcare systems.  Past medical history, social, surgical and family history all reviewed in electronic medical record.  No pertanent information unless stated regarding to the chief complaint.   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 POSITIVE muscle aches  Objective  Blood pressure 120/76, pulse 74, height '5\' 7"'$  (1.702 m), weight 143 lb (64.9 kg), SpO2 95 %.   General: No apparent distress alert and oriented x3 mood and affect normal, dressed appropriately.  HEENT: Pupils equal, extraocular movements intact  Respiratory: Patient's speak in full sentences and does not appear short of breath  Cardiovascular: No lower extremity edema, non tender, no erythema  Neck exam does have some limited sidebending bilaterally.  Tightness noted in the  parascapular region right greater than left.  Very mild muscle spasm with trigger points in the right shoulder noted today as well.  Osteopathic findings  \C4 flexed rotated and side bent right C6 flexed rotated and side bent right T3 extended rotated and side bent right inhaled rib T9 extended rotated and side bent left        Assessment and Plan:  Neck pain Patient does have some neck pain overall but is improving already.  Responding extremely well to osteopathic manipulation.  Did really well with the osteopathic manipulation again today.  Patient will continue to work on the strengthening exercises and follow-up with me again in 2 months   Nonallopathic problems  Decision today to treat with OMT was based on Physical Exam  After verbal consent patient was treated with HVLA, ME, FPR techniques in cervical, rib, thoracic areas  Patient tolerated the procedure well with improvement in symptoms  Patient given exercises, stretches and lifestyle modifications  See medications in patient instructions if given  Patient will follow up in 8 weeks      The above documentation has been reviewed and is accurate and complete Lyndal Pulley, DO        Note: This dictation was prepared with Dragon dictation along with smaller phrase technology. Any transcriptional errors that result from this process are unintentional.

## 2021-11-23 ENCOUNTER — Ambulatory Visit: Payer: BC Managed Care – PPO | Admitting: Family Medicine

## 2021-11-23 ENCOUNTER — Encounter: Payer: Self-pay | Admitting: Family Medicine

## 2021-11-23 VITALS — BP 120/76 | HR 74 | Ht 67.0 in | Wt 143.0 lb

## 2021-11-23 DIAGNOSIS — M9908 Segmental and somatic dysfunction of rib cage: Secondary | ICD-10-CM | POA: Diagnosis not present

## 2021-11-23 DIAGNOSIS — M542 Cervicalgia: Secondary | ICD-10-CM

## 2021-11-23 DIAGNOSIS — M9902 Segmental and somatic dysfunction of thoracic region: Secondary | ICD-10-CM

## 2021-11-23 DIAGNOSIS — M9901 Segmental and somatic dysfunction of cervical region: Secondary | ICD-10-CM

## 2021-11-23 NOTE — Assessment & Plan Note (Signed)
Patient does have some neck pain overall but is improving already.  Responding extremely well to osteopathic manipulation.  Did really well with the osteopathic manipulation again today.  Patient will continue to work on the strengthening exercises and follow-up with me again in 2 months

## 2021-11-23 NOTE — Patient Instructions (Signed)
Good to see you! You are making great strides already No changes except stop being Hercules with the wand exercise See you again in 2 months

## 2021-12-15 ENCOUNTER — Other Ambulatory Visit: Payer: Self-pay | Admitting: Obstetrics and Gynecology

## 2021-12-15 DIAGNOSIS — Z1231 Encounter for screening mammogram for malignant neoplasm of breast: Secondary | ICD-10-CM

## 2022-01-10 ENCOUNTER — Ambulatory Visit
Admission: RE | Admit: 2022-01-10 | Discharge: 2022-01-10 | Disposition: A | Discharge status: Home / Self Care | Payer: BC Managed Care – PPO | Source: Ambulatory Visit | Attending: Obstetrics and Gynecology | Admitting: Obstetrics and Gynecology

## 2022-01-10 DIAGNOSIS — Z1231 Encounter for screening mammogram for malignant neoplasm of breast: Secondary | ICD-10-CM | POA: Insufficient documentation

## 2022-01-22 NOTE — Progress Notes (Deleted)
  [place] Phone: [phone] Subjective:    I'm seeing this patient by the request  of:  Rusty Aus, MD  CC:   [id number]  Anna [name] is a 66 y.o. female coming in with complaint of back and neck pain. OMT 11/23/2021. Patient states   Medications patient has been prescribed: None  Taking:         Reviewed prior external information including notes and imaging from previsou exam, outside providers and external EMR if available.   As well as notes that were available from care everywhere and other healthcare systems.  Past medical history, social, surgical and family history all reviewed in electronic medical record.  No pertanent information unless stated regarding to the chief complaint.   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 POSITIVE muscle aches  Objective  There were no vitals taken for this visit.   General: No apparent distress alert and oriented x3 mood and affect normal, dressed appropriately.  HEENT: Pupils equal, extraocular movements intact  Respiratory: Patient's speak in full sentences and does not appear short of breath  Cardiovascular: No lower extremity edema, non tender, no erythema  Gait MSK:  Back   Osteopathic findings  C2 flexed rotated and side bent right C6 flexed rotated and side bent left T3 extended rotated and side bent right inhaled rib T9 extended rotated and side bent left L2 flexed rotated and side bent right Sacrum right on right        Assessment and Plan:  No problem-specific Assessment & Plan notes found for this encounter.    Nonallopathic problems  Decision today to treat with OMT was based on Physical Exam  After verbal consent patient was treated with HVLA, ME, FPR techniques in cervical, rib, thoracic, lumbar, and sacral  areas  Patient tolerated the procedure well with improvement in symptoms  Patient given exercises, stretches and lifestyle modifications  See medications in patient instructions if given  Patient will follow up in 4-8 weeks             Note: This dictation was prepared with Dragon dictation along with smaller phrase technology. Any transcriptional errors that result from this process are unintentional.

## 2022-01-23 ENCOUNTER — Ambulatory Visit: Payer: BC Managed Care – PPO | Admitting: Family Medicine

## 2022-01-25 NOTE — Progress Notes (Unsigned)
  [name] [address] [place] Phone: [phone] Subjective:   [name], am serving as a scribe for Dr. Hulan Saas.  I'm seeing this patient by the request  of:  Rusty Aus, MD  CC: Neck pain  [id number]  Anna [name] is a 66 y.o. female coming in with complaint of back and neck pain. OMT on 11/23/2021. Patient states same per usual. Little tension on the right side of neck. No new issues.  No radiation.   Medications patient has been prescribed: None  Taking:         Past Medical History:  Diagnosis Date   Hx of squamous cell carcinoma of skin 04/25/2015   L chest parasternal    Allergies  Allergen Reactions   Ciprofloxacin Rash     Review of Systems:  No headache, visual changes, nausea, vomiting, diarrhea, constipation, dizziness, abdominal pain, skin rash, fevers, chills, night sweats, weight loss, swollen lymph nodes, body aches, joint swelling, chest pain, shortness of breath, mood changes. POSITIVE muscle aches  Objective  Blood pressure 112/64, pulse 86, height '5\' 7"'$  (1.702 m), weight 144 lb (65.3 kg), SpO2 97 %.   General: No apparent distress alert and oriented x3 mood and affect normal, dressed appropriately.  HEENT: Pupils equal, extraocular movements intact  Respiratory: Patient's speak in full sentences and does not appear short of breath  Cardiovascular: No lower extremity edema, non tender, no erythema  Gait MSK:  Back does have some loss of lordosis.  Neck exam does have some loss of lordosis as well.  Patient does have more tightness noted in the parascapular regions left greater than right.  Osteopathic findings  C5 flexed rotated and side bent left T3 extended rotated and side bent right inhaled rib T9 extended rotated and side bent left L4 flexed rotated and side bent left Sacrum left on left       Assessment and Plan:  Neck pain Tightness noted.  Responded  extremely well to osteopathic manipulation.  Patient has had a lot more stress recently.  Wants to avoid.  Patient will follow-up with me again in 8 weeks    Nonallopathic problems  Decision today to treat with OMT was based on Physical Exam  After verbal consent patient was treated with HVLA, ME, FPR techniques in cervical, rib, thoracic, lumbar, and sacral  areas  Patient tolerated the procedure well with improvement in symptoms  Patient given exercises, stretches and lifestyle modifications  See medications in patient instructions if given  Patient will follow up in 4-8 weeks    The above documentation has been reviewed and is accurate and complete Lyndal Pulley, DO          Note: This dictation was prepared with Dragon dictation along with smaller phrase technology. Any transcriptional errors that result from this process are unintentional.

## 2022-01-30 ENCOUNTER — Ambulatory Visit: Payer: BC Managed Care – PPO | Admitting: Family Medicine

## 2022-01-30 VITALS — BP 112/64 | HR 86 | Ht 67.0 in | Wt 144.0 lb

## 2022-01-30 DIAGNOSIS — M542 Cervicalgia: Secondary | ICD-10-CM

## 2022-01-30 DIAGNOSIS — M9903 Segmental and somatic dysfunction of lumbar region: Secondary | ICD-10-CM

## 2022-01-30 DIAGNOSIS — M9904 Segmental and somatic dysfunction of sacral region: Secondary | ICD-10-CM | POA: Diagnosis not present

## 2022-01-30 DIAGNOSIS — M9901 Segmental and somatic dysfunction of cervical region: Secondary | ICD-10-CM

## 2022-01-30 DIAGNOSIS — M9902 Segmental and somatic dysfunction of thoracic region: Secondary | ICD-10-CM

## 2022-01-30 DIAGNOSIS — M9908 Segmental and somatic dysfunction of rib cage: Secondary | ICD-10-CM | POA: Diagnosis not present

## 2022-01-30 NOTE — Patient Instructions (Addendum)
Good to see you! Sorry for your loss Try to become more religious with the exercises

## 2022-01-30 NOTE — Assessment & Plan Note (Signed)
Tightness noted.  Responded extremely well to osteopathic manipulation.  Patient has had a lot more stress recently.  Wants to avoid.  Patient will follow-up with me again in 8 weeks

## 2022-03-19 NOTE — Progress Notes (Unsigned)
[place] Phone: [phone] Subjective:   Anna Anna [name], am serving as a scribe for Dr. Hulan Saas.  I'm seeing this patient by the request  of:  Anna Aus, MD  CC: neck pain f/u   [id number]  Anna Anna [name] is a 66 y.o. female coming in with complaint of back and neck pain. OMT 01/30/2022. Patient states that she has constant pain in neck and thoracic spine. Pain worse after moving items at her mother in laws. R arm is now painful. Denies any numbness or tingling in the arm.   Medications patient has been prescribed: None  Taking:         Reviewed prior external information including notes and imaging from previsou exam, outside providers and external EMR if available.   As well as notes that were available from care everywhere and other healthcare systems.  Past medical history, social, surgical and family history all reviewed in electronic medical record.  Anna [name] pertanent information unless stated regarding to the chief complaint.   Past Medical History:  Diagnosis Date   Hx of squamous cell carcinoma of skin 04/25/2015   L chest parasternal    Allergies  Allergen Reactions   Ciprofloxacin Rash     Review of Systems:  Anna [name], visual changes, nausea, vomiting, diarrhea, constipation, dizziness, abdominal pain, skin rash, fevers, chills, night sweats, weight loss, swollen lymph nodes, body aches, joint swelling, chest pain, shortness of breath, mood changes. POSITIVE muscle aches  Objective  Blood pressure 118/82, pulse 74, height '5\' 7"'$  (1.702 m), weight 144 lb (65.3 kg), SpO2 98 %.   General: Anna [name] alert and oriented x3 mood and affect normal, dressed appropriately.  HEENT: Pupils equal, extraocular movements intact  Respiratory: Patient's speak in full sentences and does not appear short of breath  Cardiovascular: Anna [name] lower extremity edema, non tender, Anna [name]  erythema  Gait MSK:  Neck exam does have some loss of lordosis.  Some tenderness to palpation in the paraspinal musculature right greater than left.  Positive impingement noted with right shoulder and right parascapular region also has significant tightness noted.  Patient has negative Spurling's though noted at the moment.  Osteopathic findings  C2 flexed rotated and side bent right C5 flexed rotated and side bent left T3 extended rotated and side bent right inhaled rib T8 extended rotated and side bent left L2 flexed rotated and side bent right Sacrum right on right       Assessment and Plan:  Neck pain Patient does have some degenerative disc disease.  Is having some right shoulder pain as well.  Discussed which activities to do which ones to avoid.  Increase activity slowly.  Follow-up again in 6 to 8 weeks.  Worsening pain in the right shoulder will consider the possibility of ultrasound and potential injections.    Nonallopathic problems  Decision today to treat with OMT was based on Physical Exam  After verbal consent patient was treated with HVLA, ME, FPR techniques in cervical, rib, thoracic, lumbar, and sacral  areas  Patient tolerated the procedure well with improvement in symptoms  Patient given exercises, stretches and lifestyle modifications  See medications in patient instructions if given  Patient will follow up in 4-8 weeks     The above documentation has been reviewed and is accurate and complete Anna Pulley, DO         Note: This dictation was prepared with  Dragon dictation along with smaller Company secretary. Any transcriptional errors that result from this process are unintentional.

## 2022-03-20 ENCOUNTER — Ambulatory Visit: Payer: BC Managed Care – PPO | Admitting: Family Medicine

## 2022-03-20 ENCOUNTER — Encounter: Payer: Self-pay | Admitting: Family Medicine

## 2022-03-20 VITALS — BP 118/82 | HR 74 | Ht 67.0 in | Wt 144.0 lb

## 2022-03-20 DIAGNOSIS — M25811 Other specified joint disorders, right shoulder: Secondary | ICD-10-CM | POA: Insufficient documentation

## 2022-03-20 DIAGNOSIS — M542 Cervicalgia: Secondary | ICD-10-CM | POA: Diagnosis not present

## 2022-03-20 DIAGNOSIS — M9901 Segmental and somatic dysfunction of cervical region: Secondary | ICD-10-CM

## 2022-03-20 DIAGNOSIS — M9902 Segmental and somatic dysfunction of thoracic region: Secondary | ICD-10-CM

## 2022-03-20 DIAGNOSIS — M9908 Segmental and somatic dysfunction of rib cage: Secondary | ICD-10-CM | POA: Diagnosis not present

## 2022-03-20 NOTE — Assessment & Plan Note (Signed)
Likely secondary to bursitis.  Discussed topical anti-inflammatories, icing and keeping hands within peripheral vision.  Follow-up again in 6 weeks if worsening pain consider injection

## 2022-03-20 NOTE — Assessment & Plan Note (Signed)
Patient does have some degenerative disc disease.  Is having some right shoulder pain as well.  Discussed which activities to do which ones to avoid.  Increase activity slowly.  Follow-up again in 6 to 8 weeks.  Worsening pain in the right shoulder will consider the possibility of ultrasound and potential injections.

## 2022-03-20 NOTE — Patient Instructions (Signed)
Ice  [place] Hands in peripheral vision See me in 6-8 weeks

## 2022-04-25 NOTE — Progress Notes (Signed)
[facility] [place] Phone: [phone] Subjective:   Rito Ehrlich, am serving as a scribe for Dr. Hulan Saas.  I'm seeing this patient by the request  of:  Rusty Aus, MD  CC: Back and neck pain follow-up  [id number]  Anna [name] is a 66 y.o. female coming in with complaint of back and neck pain. OMT 03/20/2022. Patient states that her right side of neck radiating down the shoulder is still tight but better.   Medications patient has been prescribed: None  Taking:         Reviewed prior external information including notes and imaging from previsou exam, outside providers and external EMR if available.   As well as notes that were available from care everywhere and other healthcare systems.  Past medical history, social, surgical and family history all reviewed in electronic medical record.  No pertanent information unless stated regarding to the chief complaint.   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 POSITIVE muscle aches  Objective  Blood pressure 120/82, pulse 76, height '5\' 7"'$  (1.702 m), weight 144 lb (65.3 kg), SpO2 98 %.   General: No apparent distress alert and oriented x3 mood and affect normal, dressed appropriately.  HEENT: Pupils equal, extraocular movements intact  Respiratory: Patient's speak in full sentences and does not appear short of breath  Cardiovascular: No lower extremity edema, non tender, no erythema  Gait MSK:  Back does have some loss of lordosis.  Patient's neck  though does have tightness noted on the right greater than left.  Patient does also have what appears to be tightness in the right scapular region as well.  Seems to be mostly musculature in nature.  Osteopathic findings  C2 flexed rotated and side bent right C6 flexed rotated and side bent left T3 extended rotated and side bent right inhaled rib T9 extended rotated and side bent right inhaled rib L2 flexed rotated and side bent right Sacrum right on right       Assessment and Plan:  Neck pain Continue tightness noted but is improving.  Patient did have great range of motion today at the end of osteopathic manipulation.  We discussed her posture and ergonomics again.  Patient will continue to get the massage therapy.  Follow-up with me again in 2 to 3 months.    Nonallopathic problems  Decision today to treat with OMT was based on Physical Exam  After verbal consent patient was treated with HVLA, ME, FPR techniques in cervical, rib, thoracic, lumbar, and sacral  areas  Patient tolerated the procedure well with improvement in symptoms  Patient given exercises, stretches and lifestyle modifications  See medications in patient instructions if given  Patient will follow up in 4-8 weeks     The above documentation has been reviewed and is accurate and complete Lyndal Pulley, DO         Note: This dictation was prepared with Dragon dictation along with smaller phrase technology. Any transcriptional errors that result from this process are unintentional.

## 2022-05-01 ENCOUNTER — Ambulatory Visit: Payer: BC Managed Care – PPO | Admitting: Family Medicine

## 2022-05-01 VITALS — BP 120/82 | HR 76 | Ht 67.0 in | Wt 144.0 lb

## 2022-05-01 DIAGNOSIS — M9903 Segmental and somatic dysfunction of lumbar region: Secondary | ICD-10-CM

## 2022-05-01 DIAGNOSIS — M9908 Segmental and somatic dysfunction of rib cage: Secondary | ICD-10-CM | POA: Diagnosis not present

## 2022-05-01 DIAGNOSIS — M9904 Segmental and somatic dysfunction of sacral region: Secondary | ICD-10-CM

## 2022-05-01 DIAGNOSIS — M542 Cervicalgia: Secondary | ICD-10-CM | POA: Diagnosis not present

## 2022-05-01 DIAGNOSIS — M9902 Segmental and somatic dysfunction of thoracic region: Secondary | ICD-10-CM

## 2022-05-01 DIAGNOSIS — M9901 Segmental and somatic dysfunction of cervical region: Secondary | ICD-10-CM | POA: Diagnosis not present

## 2022-05-01 NOTE — Assessment & Plan Note (Signed)
Continue tightness noted but is improving.  Patient did have great range of motion today at the end of osteopathic manipulation.  We discussed her posture and ergonomics again.  Patient will continue to get the massage therapy.  Follow-up with me again in 2 to 3 months.

## 2022-05-01 NOTE — Patient Instructions (Signed)
Good to see you  No big changes You are doing great Follow up in 8 weeks

## 2022-05-09 ENCOUNTER — Ambulatory Visit: Payer: BC Managed Care – PPO | Admitting: Dermatology

## 2022-05-09 DIAGNOSIS — D229 Melanocytic nevi, unspecified: Secondary | ICD-10-CM

## 2022-05-09 DIAGNOSIS — L814 Other melanin hyperpigmentation: Secondary | ICD-10-CM

## 2022-05-09 DIAGNOSIS — Z1283 Encounter for screening for malignant neoplasm of skin: Secondary | ICD-10-CM | POA: Diagnosis not present

## 2022-05-09 DIAGNOSIS — L821 Other seborrheic keratosis: Secondary | ICD-10-CM | POA: Diagnosis not present

## 2022-05-09 DIAGNOSIS — L578 Other skin changes due to chronic exposure to nonionizing radiation: Secondary | ICD-10-CM

## 2022-05-09 DIAGNOSIS — Q825 Congenital non-neoplastic nevus: Secondary | ICD-10-CM | POA: Diagnosis not present

## 2022-05-09 DIAGNOSIS — L820 Inflamed seborrheic keratosis: Secondary | ICD-10-CM

## 2022-05-09 NOTE — Patient Instructions (Signed)
Due to recent changes in healthcare laws, you may see results of your pathology and/or laboratory studies on MyChart before the doctors have had a chance to review them. We understand that in some cases there may be results that are confusing or concerning to you. Please understand that not all results are received at the same time and often the doctors may need to interpret multiple results in order to provide you with the best plan of care or course of treatment. Therefore, we ask that you please give us 2 business days to thoroughly review all your results before contacting the office for clarification. Should we see a critical lab result, you will be contacted sooner.   If You Need Anything After Your Visit  If you have any questions or concerns for your doctor, please call our main line at [phone] and press option 4 to reach your doctor's medical assistant. If no one answers, please leave a voicemail as directed and we will return your call as soon as possible. Messages left after 4 pm will be answered the following business day.   You may also send us a message via MyChart. We typically respond to MyChart messages within 1-2 business days.  For prescription refills, please ask your pharmacy to contact our office. Our fax number is [phone].  If you have an urgent issue when the clinic is closed that cannot wait until the next business day, you can page your doctor at the number below.    Please note that while we do our best to be available for urgent issues outside of office hours, we are not available 24/7.   If you have an urgent issue and are unable to reach us, you may choose to seek medical care at your doctor's office, retail clinic, urgent care center, or emergency room.  If you have a medical emergency, please immediately call 911 or go to the emergency department.  Pager Numbers  - Dr. Kowalski: [phone]  - Dr. Moye: [phone]  - Dr. Stewart:  [phone]  In the event of inclement weather, please call our main line at [phone] for an update on the status of any delays or closures.  Dermatology Medication Tips: Please keep the boxes that topical medications come in in order to help keep track of the instructions about where and how to use these. Pharmacies typically print the medication instructions only on the boxes and not directly on the medication tubes.   If your medication is too expensive, please contact our office at [phone] option 4 or send us a message through MyChart.   We are unable to tell what your co-pay for medications will be in advance as this is different depending on your insurance coverage. However, we may be able to find a substitute medication at lower cost or fill out paperwork to get insurance to cover a needed medication.   If a prior authorization is required to get your medication covered by your insurance company, please allow us 1-2 business days to complete this process.  Drug prices often vary depending on where the prescription is filled and some pharmacies may offer cheaper prices.  The website www.goodrx.com contains coupons for medications through different pharmacies. The prices here do not account for what the cost may be with help from insurance (it may be cheaper with your insurance), but the website can give you the price if you did not use any insurance.  - You can print the associated coupon and take it with   your prescription to the pharmacy.  - You may also stop by our office during regular business hours and pick up a GoodRx coupon card.  - If you need your prescription sent electronically to a different pharmacy, notify our office through [facility] MyChart or by phone at [phone] option 4.     Si Usted Necesita Algo Despus de Su Visita  Tambin puede enviarnos un mensaje a travs de MyChart. Por lo general respondemos a los mensajes de MyChart en el transcurso de 1 a 2  das hbiles.  Para renovar recetas, por favor pida a su farmacia que se ponga en contacto con nuestra oficina. Nuestro nmero de fax es el [phone].  Si tiene un asunto urgente cuando la clnica est cerrada y que no puede esperar hasta el siguiente da hbil, puede llamar/localizar a su doctor(a) al nmero que aparece a continuacin.   Por favor, tenga en cuenta que aunque hacemos todo lo posible para estar disponibles para asuntos urgentes fuera del horario de oficina, no estamos disponibles las 24 horas del da, los 7 das de la semana.   Si tiene un problema urgente y no puede comunicarse con nosotros, puede optar por buscar atencin mdica  en el consultorio de su doctor(a), en una clnica privada, en un centro de atencin urgente o en una sala de emergencias.  Si tiene una emergencia mdica, por favor llame inmediatamente al 911 o vaya a la sala de emergencias.  Nmeros de bper  - Dr. Kowalski: [phone]  - Dra. Moye: [phone]  - Dra. Stewart: [phone]  En caso de inclemencias del tiempo, por favor llame a nuestra lnea principal al [phone] para una actualizacin sobre el estado de cualquier retraso o cierre.  Consejos para la medicacin en dermatologa: Por favor, guarde las cajas en las que vienen los medicamentos de uso tpico para ayudarle a seguir las instrucciones sobre dnde y cmo usarlos. Las farmacias generalmente imprimen las instrucciones del medicamento slo en las cajas y no directamente en los tubos del medicamento.   Si su medicamento es muy caro, por favor, pngase en contacto con nuestra oficina llamando al [phone] y presione la opcin 4 o envenos un mensaje a travs de MyChart.   No podemos decirle cul ser su copago por los medicamentos por adelantado ya que esto es diferente dependiendo de la cobertura de su seguro. Sin embargo, es posible que podamos encontrar un medicamento sustituto a menor costo o llenar un formulario para que el  seguro cubra el medicamento que se considera necesario.   Si se requiere una autorizacin previa para que su compaa de seguros cubra su medicamento, por favor permtanos de 1 a 2 das hbiles para completar este proceso.  Los precios de los medicamentos varan con frecuencia dependiendo del lugar de dnde se surte la receta y alguna farmacias pueden ofrecer precios ms baratos.  El sitio web www.goodrx.com tiene cupones para medicamentos de diferentes farmacias. Los precios aqu no tienen en cuenta lo que podra costar con la ayuda del seguro (puede ser ms barato con su seguro), pero el sitio web puede darle el precio si no utiliz ningn seguro.  - Puede imprimir el cupn correspondiente y llevarlo con su receta a la farmacia.  - Tambin puede pasar por nuestra oficina durante el horario de atencin regular y recoger una tarjeta de cupones de GoodRx.  - Si necesita que su receta se enve electrnicamente a una farmacia diferente, informe a nuestra oficina a travs de MyChart de [facility]   o por telfono llamando al [phone] y presione la opcin 4.  

## 2022-05-09 NOTE — Progress Notes (Signed)
   Follow-Up Visit   Subjective  Anna [name] is a 66 y.o. female who presents for the following: Annual Exam. The patient presents for Total-Body Skin Exam (TBSE) for skin cancer screening and mole check.  The patient has spots, moles and lesions to be evaluated, some may be new or changing.  The following portions of the chart were reviewed this encounter and updated as appropriate:   Tobacco  Allergies  Meds  Problems  Med Hx  Surg Hx  Fam Hx     Review of Systems:  No other skin or systemic complaints except as noted in HPI or Assessment and Plan.  Objective  Well appearing patient in no apparent distress; mood and affect are within normal limits.  A full examination was performed including scalp, head, eyes, ears, nose, lips, neck, chest, axillae, abdomen, back, buttocks, bilateral upper extremities, bilateral lower extremities, hands, feet, fingers, toes, fingernails, and toenails. All findings within normal limits unless otherwise noted below.  L mid back Vascular macule.   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  Observation.  Call clinic for new or changing lesions.  Recommend daily use of broad spectrum spf 30+ sunscreen to sun-exposed areas.    Inflamed seborrheic keratosis R chest x 1 Symptomatic, irritating, patient would like treated. Destruction of lesion - R chest x 1 Complexity: simple   Destruction method: cryotherapy   Informed consent: discussed and consent obtained   Timeout:  patient name, date of birth, surgical site, and procedure verified Lesion destroyed using liquid nitrogen: Yes   Region frozen until ice ball extended beyond lesion: Yes   Outcome: patient tolerated procedure well with no complications   Post-procedure details: wound care instructions given    Lentigines - Scattered tan macules - Due to sun exposure - Benign-appearing,  observe - Recommend daily broad spectrum sunscreen SPF 30+ to sun-exposed areas, reapply every 2 hours as needed. - Call for any changes  Seborrheic Keratoses - Stuck-on, waxy, tan-brown papules and/or plaques  - Benign-appearing - Discussed benign etiology and prognosis. - Observe - Call for any changes  Melanocytic Nevi - Tan-brown and/or pink-flesh-colored symmetric macules and papules - Benign appearing on exam today - Observation - Call clinic for new or changing moles - Recommend daily use of broad spectrum spf 30+ sunscreen to sun-exposed areas.   Hemangiomas - Red papules - Discussed benign nature - Observe - Call for any changes  Actinic Damage - Chronic condition, secondary to cumulative UV/sun exposure - diffuse scaly erythematous macules with underlying dyspigmentation - Recommend daily broad spectrum sunscreen SPF 30+ to sun-exposed areas, reapply every 2 hours as needed.  - Staying in the shade or wearing long sleeves, sun glasses (UVA+UVB protection) and wide brim hats (4-inch brim around the entire circumference of the hat) are also recommended for sun protection.  - Call for new or changing lesions.  Skin cancer screening performed today.  Return for TBSE in 1-3 years.  Luther Redo, CMA, am acting as scribe for Sarina Ser, MD . Documentation: I have reviewed the above documentation for accuracy and completeness, and I agree with the above.  Sarina Ser, MD

## 2022-05-24 ENCOUNTER — Encounter: Payer: Self-pay | Admitting: Dermatology

## 2022-06-20 NOTE — Progress Notes (Signed)
[place] Phone: [phone] Subjective:   Anna [name], am serving as a scribe for Dr. Hulan Saas.  I'm seeing this patient by the request  of:  Rusty Aus, MD  CC: Low back and neck pain follow-up  [id number]  Anna [name] is a 66 y.o. female coming in with complaint of back and neck pain. OMT 05/01/2022. Patient states that L side of her lower back started to both her over the weekend. Was moving a lot of Chrismt[employer] decorations.   Jammed finger in Oct 31st, L hand ring finger. Swelling over PIP jt. Unable to get her wedding ring on.   Medications patient has been prescribed:   Taking:         Reviewed prior external information including notes and imaging from previsou exam, outside providers and external EMR if available.   As well as notes that were available from care everywhere and other healthcare systems.  Past medical history, social, surgical and family history all reviewed in electronic medical record.  [name] pertanent information unless stated regarding to the chief complaint.   Past Medical History:  Diagnosis Date   Hx of squamous cell carcinoma of skin 04/25/2015   L chest parasternal    Allergies  Allergen Reactions   Ciprofloxacin Rash     Review of Systems:  [name], visual changes, nausea, vomiting, diarrhea, constipation, dizziness, abdominal pain, skin rash, fevers, chills, night sweats, weight loss, swollen lymph nodes, body aches, joint swelling, chest pain, shortness of breath, mood changes. POSITIVE muscle aches  Objective  Blood pressure 120/80, pulse 66, height '5\' 7"'$  (1.702 m), weight 147 lb (66.7 kg), SpO2 99 %.   General: [name] alert and oriented x3 mood and affect normal, dressed appropriately.  HEENT: Pupils equal, extraocular movements intact  Respiratory: Patient's speak in full sentences and does not appear short of breath   Cardiovascular: [name] lower extremity edema, non tender, [name] erythema  Patient's left ring finger does have some swelling noted over the PIP.  Patient does have tenderness over the lateral plate.  With retraction and patient does have audible pop and had some improvement in range of motion almost immediately. Low back exam does have some mild loss of lordosis.  Neck exam does have some limited sidebending bilaterally.  [name] crepitus noted in multiple different areas.  5 out of 5 strength of the lower extremities.  Osteopathic findings  C2 flexed rotated and side bent left C6 flexed rotated and side bent left T5 extended rotated and side bent right inhaled rib L5 flexed rotated and side bent right Sacrum right on right       Assessment and Plan:  Neck pain Degenerative disc disease.  Has responded well to go to osteopathic manipulation.  Did have some tightness in the lower back and we did monitor that as well.  We used more muscle energy than HVLA secondary to patient's osteoporosis.  Discussed with patient about icing regimen and home exercises.  Follow-up again in 6 to 8 weeks    Nonallopathic problems  Decision today to treat with OMT was based on Physical Exam  After verbal consent patient was treated with  ME, FPR techniques in cervical, rib, thoracic, lumbar, and sacral  areas  Patient tolerated the procedure well with  symptoms  Patient given exercises, stretches and lifestyle modifications  See medications in patient instructions if given  Patient will follow up in  4-8 weeks     The above documentation has been reviewed and is accurate and complete Anna Pulley, DO         Note: This dictation was prepared with Dragon dictation along with smaller phrase technology. Any transcriptional errors that result from this process are unintentional.

## 2022-06-26 ENCOUNTER — Ambulatory Visit (INDEPENDENT_AMBULATORY_CARE_PROVIDER_SITE_OTHER): Payer: BC Managed Care – PPO | Admitting: Family Medicine

## 2022-06-26 VITALS — BP 120/80 | HR 66 | Ht 67.0 in | Wt 147.0 lb

## 2022-06-26 DIAGNOSIS — M9904 Segmental and somatic dysfunction of sacral region: Secondary | ICD-10-CM

## 2022-06-26 DIAGNOSIS — M9908 Segmental and somatic dysfunction of rib cage: Secondary | ICD-10-CM

## 2022-06-26 DIAGNOSIS — M9903 Segmental and somatic dysfunction of lumbar region: Secondary | ICD-10-CM

## 2022-06-26 DIAGNOSIS — M542 Cervicalgia: Secondary | ICD-10-CM

## 2022-06-26 DIAGNOSIS — M9901 Segmental and somatic dysfunction of cervical region: Secondary | ICD-10-CM | POA: Diagnosis not present

## 2022-06-26 DIAGNOSIS — M9902 Segmental and somatic dysfunction of thoracic region: Secondary | ICD-10-CM

## 2022-06-26 NOTE — Patient Instructions (Signed)
Try taping it daily for a week.  Might need ice for a day or 2 on the finger  See me again in 8 weeks and can't wait to hear about the girlfriend

## 2022-06-26 NOTE — Assessment & Plan Note (Signed)
Degenerative disc disease.  Has responded well to go to osteopathic manipulation.  Did have some tightness in the lower back and we did monitor that as well.  We used more muscle energy than HVLA secondary to patient's osteoporosis.  Discussed with patient about icing regimen and home exercises.  Follow-up again in 6 to 8 weeks

## 2022-08-16 DIAGNOSIS — D369 Benign neoplasm, unspecified site: Secondary | ICD-10-CM | POA: Insufficient documentation

## 2022-08-21 ENCOUNTER — Ambulatory Visit: Payer: BC Managed Care – PPO | Admitting: Family Medicine

## 2022-08-21 ENCOUNTER — Encounter: Payer: Self-pay | Admitting: Family Medicine

## 2022-08-21 VITALS — BP 114/72 | HR 83 | Ht 67.0 in | Wt 144.0 lb

## 2022-08-21 DIAGNOSIS — M9901 Segmental and somatic dysfunction of cervical region: Secondary | ICD-10-CM | POA: Diagnosis not present

## 2022-08-21 DIAGNOSIS — M542 Cervicalgia: Secondary | ICD-10-CM | POA: Diagnosis not present

## 2022-08-21 DIAGNOSIS — M9908 Segmental and somatic dysfunction of rib cage: Secondary | ICD-10-CM | POA: Diagnosis not present

## 2022-08-21 DIAGNOSIS — M9902 Segmental and somatic dysfunction of thoracic region: Secondary | ICD-10-CM | POA: Diagnosis not present

## 2022-08-21 NOTE — Progress Notes (Signed)
  [facility] [place] [address] [place] Phone: [phone] Subjective:   Anna [name], am serving as a scribe for Dr. Hulan Saas.  I'm seeing this patient by the request  of:  Rusty Aus, MD  CC: Back and neck pain  [id number]  Anna [name] is a 67 y.o. female coming in with complaint of back and neck pain. OMT on 06/26/2022. Patient states same per usual. No new concerns.  Medications patient has been prescribed:   Taking:         Reviewed prior external information including notes and imaging from previsou exam, outside providers and external EMR if available.   As well as notes that were available from care everywhere and other healthcare systems.  Past medical history, social, surgical and family history all reviewed in electronic medical record.  No pertanent information unless stated regarding to the chief complaint.   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 POSITIVE muscle aches  Objective  Blood pressure 114/72, pulse 83, height 5' 7"$  (1.702 m), weight 144 lb (65.3 kg), SpO2 91 %.   General: No apparent distress alert and oriented x3 mood and affect normal, dressed appropriately.  HEENT: Pupils equal, extraocular movements intact  Respiratory: Patient's speak in full sentences and does not appear short of breath  Cardiovascular: No lower extremity edema, non tender, no erythema  Low back exam does have some loss of lordosis.  Some tenderness to palpation in the paraspinal musculature.  Osteopathic  findings  C2 flexed rotated and side bent right C6 flexed rotated and side bent left C7 flexed rotated and side bent right T3 extended rotated and side bent right inhaled rib T9 extended rotated and side bent left      Assessment and Plan:  Neck pain Continues to have some cervical discomfort.  Seems to be more still secondary to arthritic changes.  Discussed with patient again still we do have ways to potentially increase activity and icing regimen.  Discussed of certain activities that I think will be beneficial.  Follow-up with me again in 6 to 8 weeks.  Has responded relatively well to osteopathic manipulation is one of the treatment options.    Nonallopathic problems  Decision today to treat with OMT was based on Physical Exam  After verbal consent patient was treated with HVLA, ME, FPR techniques in cervical, rib, thoracic areas  Patient tolerated the procedure well with improvement in symptoms  Patient given exercises, stretches and lifestyle modifications  See medications in patient instructions if given  Patient will follow up in 4-8 weeks     The above documentation has been reviewed and is accurate and complete Lyndal Pulley, DO         Note: This dictation was prepared with Dragon dictation along with smaller phrase technology. Any transcriptional errors that result from this process are unintentional.

## 2022-08-21 NOTE — Patient Instructions (Signed)
Good to see you!  See you again in 7-8 weeks

## 2022-08-21 NOTE — Assessment & Plan Note (Signed)
Continues to have some cervical discomfort.  Seems to be more still secondary to arthritic changes.  Discussed with patient again still we do have ways to potentially increase activity and icing regimen.  Discussed of certain activities that I think will be beneficial.  Follow-up with me again in 6 to 8 weeks.  Has responded relatively well to osteopathic manipulation is one of the treatment options.

## 2022-10-15 NOTE — Progress Notes (Unsigned)
  [name] [address] [place] [address] Phone: [phone] Subjective:   [name], am serving as a scribe for Dr. Antoine Primas.  I'm seeing this patient by the request  of:  Danella Penton, MD  CC: Back and neck pain follow-up  [id number]  Anna [name] is a 67 y.o. female coming in with complaint of back and neck pain. OMT 08/21/2022. Patient states doing well. Has notice having more full body aches after walks and sometimes before. Ring finger on L hand still unable to wear ring. No other concerns.  Medications patient has been prescribed: None      Reviewed prior external information including notes and imaging from previsou exam, outside providers and external EMR if available.   As well as notes that were available from care everywhere and other healthcare systems.  Reviewing patient's labs patient has had low B12 for quite some time last from the 26th  Past medical history, social, surgical and family history all reviewed in electronic medical record.  No pertanent information unless stated regarding to the chief complaint.   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 POSITIVE muscle aches  Objective  Blood pressure 118/66, pulse 81, height  (1.702 m), weight 144 lb (65.3 kg), SpO2 94 %.   General: No apparent distress alert and oriented x3 mood and affect normal, dressed appropriately.  HEENT: Pupils equal, extraocular movements intact  Respiratory: Patient's speak in full sentences and does not appear short of breath   Cardiovascular: No lower extremity edema, non tender, no erythema  Low back exam does have some loss lordosis noted.  Tightness noted with Pearlean Brownie right greater than left.  Tightness in the parascapular area noted as well.  Osteopathic findings  C6 flexed rotated and side bent right T3 extended rotated and side bent right inhaled rib T8 extended rotated and side bent left L1 flexed rotated and side bent right Sacrum right on right    Assessment and Plan:  Neck pain Chronic overall, patient has had some increasing fatigue, has had B12 deficiency discussed icing regimen and home exercises.  Follow-up again 6 to 8 weeks  B12 deficiency Injection given today and tolerated procedure well.    Nonallopathic problems  Decision today to treat with OMT was based on Physical Exam  After verbal consent patient was treated with HVLA, ME, FPR techniques in cervical, rib, thoracic, lumbar, and sacral  areas  Patient tolerated the procedure well with improvement in symptoms  Patient given exercises, stretches and lifestyle modifications  See medications in patient instructions if given  Patient will follow up in 4-8 weeks     The above documentation has been reviewed and is accurate and complete Judi Saa, DO         Note: This dictation was prepared with Dragon dictation along with smaller phrase technology. Any transcriptional errors that result from this process are unintentional.

## 2022-10-16 ENCOUNTER — Encounter: Payer: Self-pay | Admitting: Family Medicine

## 2022-10-16 ENCOUNTER — Ambulatory Visit: Payer: BC Managed Care – PPO | Admitting: Family Medicine

## 2022-10-16 VITALS — BP 118/66 | HR 81 | Ht 67.0 in | Wt 144.0 lb

## 2022-10-16 DIAGNOSIS — E538 Deficiency of other specified B group vitamins: Secondary | ICD-10-CM | POA: Diagnosis not present

## 2022-10-16 DIAGNOSIS — M9908 Segmental and somatic dysfunction of rib cage: Secondary | ICD-10-CM

## 2022-10-16 DIAGNOSIS — M542 Cervicalgia: Secondary | ICD-10-CM

## 2022-10-16 DIAGNOSIS — M9902 Segmental and somatic dysfunction of thoracic region: Secondary | ICD-10-CM

## 2022-10-16 DIAGNOSIS — M9901 Segmental and somatic dysfunction of cervical region: Secondary | ICD-10-CM

## 2022-10-16 DIAGNOSIS — M9904 Segmental and somatic dysfunction of sacral region: Secondary | ICD-10-CM

## 2022-10-16 DIAGNOSIS — M9903 Segmental and somatic dysfunction of lumbar region: Secondary | ICD-10-CM | POA: Diagnosis not present

## 2022-10-16 MED ORDER — CYANOCOBALAMIN 1000 MCG/ML IJ SOLN
1000.0000 ug | Freq: Once | INTRAMUSCULAR | Status: AC
  Administered 2022-10-16: 1000 ug via INTRAMUSCULAR

## 2022-10-16 NOTE — Assessment & Plan Note (Signed)
Chronic overall, patient has had some increasing fatigue, has had B12 deficiency discussed icing regimen and home exercises.  Follow-up again 6 to 8 weeks

## 2022-10-16 NOTE — Assessment & Plan Note (Signed)
Injection given today and tolerated procedure well.

## 2022-10-16 NOTE — Patient Instructions (Signed)
B12 injection today If this works can do monthly See me in 6-8 weeks

## 2022-11-27 NOTE — Progress Notes (Unsigned)
  [name] [address] [place] [address] Phone: [phone] Subjective:   Bruce Donath, am serving as a scribe for Dr. Antoine Primas.  I'm seeing this patient by the request  of:  Danella Penton, MD  CC: Neck and back pain follow-up  [id number]  Anna [name] is a 67 y.o. female coming in with complaint of back and neck pain. OMT 10/16/2022. Patient states that she is having an increase in pain in L shoulder that radiates into scapula. Pain worse when walking.   Medications patient has been prescribed: None  Taking:         Reviewed prior external information including notes and imaging from previsou exam, outside providers and external EMR if available.   As well as notes that were available from care everywhere and other healthcare systems.  Past medical history, social, surgical and family history all reviewed in electronic medical record.  No pertanent information unless stated regarding to the chief complaint.   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 POSITIVE muscle aches  Objective  Blood pressure 118/80, pulse 87, height 5\' 7"  (1.702 m), weight 143 lb (64.9 kg), SpO2 96 %.   General: No apparent distress alert and oriented x3 mood and affect normal, dressed appropriately.  HEENT: Pupils equal, extraocular movements intact  Respiratory: Patient's speak in full sentences and does not appear short of breath  Cardiovascular: No lower extremity edema, non tender, no erythema  Neck exam does have some loss lordosis noted.   Some tenderness to palpation in the paraspinal musculature.  Tightness noted in the left side of the parascapular area  Osteopathic findings C6 flexed rotated and side bent left T3 extended rotated and side bent left inhaled rib T7 extended rotated and side bent left inhaled. L2 flexed rotated and side bent right Sacrum right on right     Assessment and Plan:  B12 deficiency B12 injection given today.  Will continue monthly with nurse visits    Nonallopathic problems  Decision today to treat with OMT was based on Physical Exam  After verbal consent patient was treated with HVLA, ME, FPR techniques in cervical, rib, thoracic, lumbar, and sacral  areas  Patient tolerated the procedure well with improvement in symptoms  Patient given exercises, stretches and lifestyle modifications  See medications in patient instructions if given  Patient will follow up in 4-8 weeks    The above documentation has been reviewed and is accurate and complete Anna Saa, DO          Note: This dictation was prepared with Dragon dictation along with smaller phrase technology. Any transcriptional errors that result from this process are unintentional.

## 2022-11-28 ENCOUNTER — Ambulatory Visit (INDEPENDENT_AMBULATORY_CARE_PROVIDER_SITE_OTHER): Payer: Medicare Other | Admitting: Family Medicine

## 2022-11-28 VITALS — BP 118/80 | HR 87 | Ht 67.0 in | Wt 143.0 lb

## 2022-11-28 DIAGNOSIS — M9904 Segmental and somatic dysfunction of sacral region: Secondary | ICD-10-CM | POA: Diagnosis not present

## 2022-11-28 DIAGNOSIS — M9901 Segmental and somatic dysfunction of cervical region: Secondary | ICD-10-CM | POA: Diagnosis not present

## 2022-11-28 DIAGNOSIS — M9908 Segmental and somatic dysfunction of rib cage: Secondary | ICD-10-CM

## 2022-11-28 DIAGNOSIS — M9902 Segmental and somatic dysfunction of thoracic region: Secondary | ICD-10-CM

## 2022-11-28 DIAGNOSIS — M542 Cervicalgia: Secondary | ICD-10-CM | POA: Diagnosis not present

## 2022-11-28 DIAGNOSIS — M9903 Segmental and somatic dysfunction of lumbar region: Secondary | ICD-10-CM | POA: Diagnosis not present

## 2022-11-28 DIAGNOSIS — E538 Deficiency of other specified B group vitamins: Secondary | ICD-10-CM

## 2022-11-28 MED ORDER — CYANOCOBALAMIN 1000 MCG/ML IJ SOLN
1000.0000 ug | Freq: Once | INTRAMUSCULAR | Status: AC
  Administered 2022-11-28: 1000 ug via INTRAMUSCULAR

## 2022-11-28 NOTE — Patient Instructions (Addendum)
B12 injection today Much better movement See me again in 2 months

## 2022-11-28 NOTE — Assessment & Plan Note (Signed)
B12 injection given today.  Will continue monthly with nurse visits

## 2022-11-28 NOTE — Addendum Note (Signed)
Addended by: Evon Slack on: 11/28/2022 04:17 PM   Modules accepted: Orders

## 2022-12-26 ENCOUNTER — Ambulatory Visit (INDEPENDENT_AMBULATORY_CARE_PROVIDER_SITE_OTHER): Payer: Medicare Other

## 2022-12-26 DIAGNOSIS — E538 Deficiency of other specified B group vitamins: Secondary | ICD-10-CM

## 2022-12-26 MED ORDER — CYANOCOBALAMIN 1000 MCG/ML IJ SOLN
1000.0000 ug | Freq: Once | INTRAMUSCULAR | Status: AC
  Administered 2022-12-26: 1000 ug via INTRAMUSCULAR

## 2022-12-26 NOTE — Progress Notes (Signed)
B12 injection given in right deltoid per Dr. Smith. Patient tolerated injection well.  

## 2023-01-29 NOTE — Progress Notes (Deleted)
  [name] [address] [place] [address] Phone: [phone] Subjective:    I'm seeing this patient by the request  of:  Danella Penton, MD  CC:   [id number]  Anna [name] is a 67 y.o. female coming in with complaint of back and neck pain. OMT 11/28/2022. Patient states   Medications patient has been prescribed: None  Taking:         Reviewed prior external information including notes and imaging from previsou exam, outside providers and external EMR if available.   As well as notes that were available from care everywhere and other healthcare systems.  Past medical history, social, surgical and family history all reviewed in electronic medical record.  No pertanent information unless stated regarding to the chief complaint.   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 POSITIVE muscle aches  Objective  There were no vitals taken for this visit.   General: No apparent distress alert and oriented x3 mood and affect normal, dressed appropriately.  HEENT: Pupils equal, extraocular movements intact  Respiratory: Patient's speak in full sentences and does not appear short of breath  Cardiovascular: No lower extremity edema, non tender, no erythema  Gait MSK:  Back   Osteopathic findings  C2 flexed rotated and side bent right C6 flexed rotated and side bent left T3 extended rotated and side bent right inhaled rib T9 extended rotated and side bent left L2 flexed rotated and side bent right Sacrum right on right        Assessment and Plan:  No problem-specific Assessment & Plan notes found for this encounter.    Nonallopathic problems  Decision today to treat with OMT was based on Physical Exam  After verbal consent patient was treated with HVLA, ME, FPR techniques in cervical, rib, thoracic, lumbar, and sacral  areas  Patient tolerated the procedure well with improvement in symptoms  Patient given exercises, stretches and lifestyle modifications  See medications in patient instructions if given  Patient will follow up in 4-8 weeks             Note: This dictation was prepared with Dragon dictation along with smaller phrase technology. Any transcriptional errors that result from this process are unintentional.

## 2023-01-30 ENCOUNTER — Ambulatory Visit: Payer: BC Managed Care – PPO | Admitting: Family Medicine

## 2023-02-07 ENCOUNTER — Ambulatory Visit (INDEPENDENT_AMBULATORY_CARE_PROVIDER_SITE_OTHER): Payer: Medicare Other

## 2023-02-07 DIAGNOSIS — E538 Deficiency of other specified B group vitamins: Secondary | ICD-10-CM

## 2023-02-07 MED ORDER — CYANOCOBALAMIN 1000 MCG/ML IJ SOLN
1000.0000 ug | Freq: Once | INTRAMUSCULAR | Status: AC
  Administered 2023-02-07: 1000 ug via INTRAMUSCULAR

## 2023-02-07 NOTE — Progress Notes (Signed)
Pt received B12 injection, right arm, tolerated well.

## 2023-02-23 ENCOUNTER — Ambulatory Visit
Admission: EM | Admit: 2023-02-23 | Discharge: 2023-02-23 | Disposition: A | Discharge status: Home / Self Care | Payer: BC Managed Care – PPO | Attending: Family Medicine | Admitting: Family Medicine

## 2023-02-23 ENCOUNTER — Ambulatory Visit (INDEPENDENT_AMBULATORY_CARE_PROVIDER_SITE_OTHER): Payer: Medicare Other

## 2023-02-23 ENCOUNTER — Other Ambulatory Visit: Payer: Self-pay

## 2023-02-23 ENCOUNTER — Encounter: Payer: Self-pay | Admitting: *Deleted

## 2023-02-23 DIAGNOSIS — W19XXXA Unspecified fall, initial encounter: Secondary | ICD-10-CM

## 2023-02-23 DIAGNOSIS — R079 Chest pain, unspecified: Secondary | ICD-10-CM

## 2023-02-23 DIAGNOSIS — Z23 Encounter for immunization: Secondary | ICD-10-CM

## 2023-02-23 DIAGNOSIS — R0789 Other chest pain: Secondary | ICD-10-CM

## 2023-02-23 MED ORDER — TETANUS-DIPHTH-ACELL PERTUSSIS 5-2.5-18.5 LF-MCG/0.5 IM SUSY
0.5000 mL | PREFILLED_SYRINGE | Freq: Once | INTRAMUSCULAR | Status: AC
  Administered 2023-02-23: 0.5 mL via INTRAMUSCULAR

## 2023-02-23 MED ORDER — KETOROLAC TROMETHAMINE 30 MG/ML IJ SOLN
30.0000 mg | Freq: Once | INTRAMUSCULAR | Status: AC
  Administered 2023-02-23: 30 mg via INTRAMUSCULAR

## 2023-02-23 MED ORDER — BACITRACIN ZINC 500 UNIT/GM EX OINT
TOPICAL_OINTMENT | Freq: Once | CUTANEOUS | Status: AC
Start: 2023-02-23 — End: 2023-02-23
  Administered 2023-02-23: 1 via TOPICAL

## 2023-02-23 NOTE — Discharge Instructions (Signed)
I will notify you once your chest x-ray has been read by radiology via MyChart.  As discussed apply warm heating pad compresses to your chest wall to help with the chest pain.  You have been treated with Toradol which is an anti-inflammatory do not take any meloxicam, naproxen or ibuprofen for least 8 hours after receiving this injection.  Also encouraged warm Epsom salt baths also help with aches and pains associated with fall.  You may also take extra strength Tylenol at any time for acute pain.  If at any point chest pain becomes persistent or unbearable and is not relieved with measures discussed please go immediately to the emergency department.

## 2023-02-23 NOTE — ED Triage Notes (Signed)
Pt reports she fell on a trail this morning. Pt fell forward and hit her chest on ground. Pt presents to [facility] with pain to chest that is worse with movement . Pt also has an abrasion to palm of RT hand. Pt also has pain to lt lower leg. Pt also has a mark on RT cheek.

## 2023-02-23 NOTE — ED Provider Notes (Signed)
Anna [name]    CSN: [id number] Arrival date & time: 02/23/23  1409      History   Chief Complaint Chief Complaint  Patient presents with   Fall    HPI Anna [name] is a 67 y.o. female.  Presents today with chest pain after sustaining a fall while going for a walk on a walking trail today.  She reports feeling pain after the fall however the pain resolved.  Subsequently after returning home she experienced some mild shortness of breath and repeated chest pain and came in for evaluation today.  He also has an abrasion on her palm and pain to the lower left leg.  She has an abrasion on her right cheek.  Endorses that chest pain is exacerbated with movement.  Is a history of chronic pain involving the cervical spine and generalized osteoporosis.  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 No pertinent surgical history.  OB History   No obstetric history on file.      Home Medications    Prior to Admission medications   Medication Sig Start Date End Date Taking? Authorizing Provider  calcium acetate (PHOSLO) 667 MG capsule Take by mouth 1 day or 1 dose.   Yes [provider]  cholecalciferol (VITAMIN D3) 25 MCG (1000 UT) tablet Take 1,000 Units by mouth daily.   Yes [provider]  citalopram (CELEXA) 20 MG tablet Take 20 mg by mouth daily.   Yes [provider]  meloxicam (MOBIC) 7.5 MG tablet Take 7.5 mg by mouth daily.   Yes [provider]  alendronate (FOSAMAX) 70 MG tablet Take by mouth. 07/10/17   [provider]    Family History Family History  Problem Relation  Age of Onset   Breast cancer Neg Hx     Social History Social History   Tobacco Use   Smoking status: Never   Smokeless tobacco: Never  Substance Use Topics   Drug use: Never     Allergies   Ciprofloxacin   Review of Systems Review of Systems   Physical Exam Triage Vital Signs ED Triage Vitals [02/23/23 1426]  Encounter Vitals Group     BP      Systolic BP Percentile      Diastolic BP Percentile      Pulse      Resp      Temp      Temp src      SpO2      Weight      Height      Head Circumference      Peak Flow      Pain Score 8     Pain Loc      Pain Education      Exclude from Growth Chart    No data found.  Updated Vital Signs There were no vitals taken for this visit.  Visual Acuity Right Eye Distance:   Left Eye Distance:   Bilateral Distance:    Right Eye Near:   Left Eye Near:    Bilateral Near:     Physical Exam   UC  Treatments / Results  Labs (all labs ordered are listed, but only abnormal results are displayed) Labs Reviewed - No data to display  EKG   Radiology No results found.  Procedures Procedures (including critical care time)  Medications Ordered in UC Medications - No data to display  Initial Impression / Assessment and Plan / UC Course  I have reviewed the triage vital signs and the nursing notes.  Pertinent labs & imaging results that were available during my care of the patient were reviewed by me and considered in my medical decision making (see chart for details).     *** Final Clinical Impressions(s) / UC Diagnoses   Final diagnoses:  Chest pain, unspecified type  Chest wall pain   Discharge Instructions   None    ED Prescriptions   None    PDMP not reviewed this encounter.

## 2023-03-05 NOTE — Progress Notes (Unsigned)
  [name] [address] [place] [address] Phone: [phone] Subjective:   Bruce Donath, am serving as a scribe for Dr. Antoine Primas.  I'm seeing this patient by the request  of:  Danella Penton, MD  CC: Back and neck pain follow-up  [id number]  Anna [name] is a 67 y.o. female coming in with complaint of back and neck pain. OMT 11/28/2022. Patient states that she was on a trail 10 days ago and she fell. Landed face first and her sternum is still painful. Went into UC. Painful to take breath and laugh.  Reviewed patient's imaging and does appear that patient does have unfortunately  Medications patient has been prescribed: None          Reviewed prior external information including notes and imaging from previsou exam, outside providers and external EMR if available.   As well as notes that were available from care everywhere and other healthcare systems.  Past medical history, social, surgical and family history all reviewed in electronic medical record.  No pertanent information unless stated regarding to the chief complaint.   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 POSITIVE muscle aches  Objective  Blood pressure 120/76, pulse 94, height 5\' 7"  (1.702 m), weight 141 lb (64 kg), SpO2 95%.   General: No apparent distress alert and oriented x3 mood and affect normal, dressed appropriately.  HEENT: Pupils equal, extraocular movements intact  Respiratory: Patient's speak in full sentences and does not appear short of breath   Cardiovascular: No lower extremity edema, non tender, no erythema  Severe tenderness over the xiphoid process still noted today.  No crepitus of the skin.  No breakdown of the skin noted.  Patient is able to take a deep breath at the moment.  Does have more pain now with resisted abduction of the arm.     Assessment and Plan:  Closed fracture of xiphoid process Patient on the x-rays do show that there was a mild displaced xiphoid process fracture noted.  Patient would like to try other conservative therapy, once weekly vitamin D.  Discussed which activities to do and which ones to avoid.  Discussed increasing activity slowly otherwise.  Follow-up again in 6 to 8 weeks otherwise.       The above documentation has been reviewed and is accurate and complete Judi Saa, DO         Note: This dictation was prepared with Dragon dictation along with smaller phrase technology. Any transcriptional errors that result from this process are unintentional.

## 2023-03-06 ENCOUNTER — Ambulatory Visit (INDEPENDENT_AMBULATORY_CARE_PROVIDER_SITE_OTHER): Payer: Medicare Other | Admitting: Family Medicine

## 2023-03-06 ENCOUNTER — Encounter: Payer: Self-pay | Admitting: Family Medicine

## 2023-03-06 ENCOUNTER — Ambulatory Visit (INDEPENDENT_AMBULATORY_CARE_PROVIDER_SITE_OTHER): Payer: Medicare Other

## 2023-03-06 VITALS — BP 120/76 | HR 94 | Ht 67.0 in | Wt 141.0 lb

## 2023-03-06 DIAGNOSIS — M25561 Pain in right knee: Secondary | ICD-10-CM

## 2023-03-06 DIAGNOSIS — S2224XA Fracture of xiphoid process, initial encounter for closed fracture: Secondary | ICD-10-CM | POA: Diagnosis not present

## 2023-03-06 DIAGNOSIS — M546 Pain in thoracic spine: Secondary | ICD-10-CM

## 2023-03-06 MED ORDER — VITAMIN D (ERGOCALCIFEROL) 1.25 MG (50000 UNIT) PO CAPS: 50000.0000 [IU] | ORAL_CAPSULE | ORAL | 0 refills | Status: AC

## 2023-03-06 NOTE — Patient Instructions (Signed)
Thoracic xray Once weekly Vit D K2 daily for on month Pillow close for coughing See me in 5 weeks

## 2023-03-06 NOTE — Assessment & Plan Note (Signed)
Patient on the x-rays do show that there was a mild displaced xiphoid process fracture noted.  Patient would like to try other conservative therapy, once weekly vitamin D.  Discussed which activities to do and which ones to avoid.  Discussed increasing activity slowly otherwise.  Follow-up again in 6 to 8 weeks otherwise.

## 2023-03-19 ENCOUNTER — Other Ambulatory Visit: Payer: Self-pay | Admitting: Obstetrics and Gynecology

## 2023-03-19 DIAGNOSIS — Z1231 Encounter for screening mammogram for malignant neoplasm of breast: Secondary | ICD-10-CM

## 2023-03-28 ENCOUNTER — Ambulatory Visit
Admission: RE | Admit: 2023-03-28 | Discharge: 2023-03-28 | Disposition: A | Discharge status: Home / Self Care | Payer: Medicare Other | Source: Ambulatory Visit | Attending: Obstetrics and Gynecology | Admitting: Obstetrics and Gynecology

## 2023-03-28 DIAGNOSIS — Z1231 Encounter for screening mammogram for malignant neoplasm of breast: Secondary | ICD-10-CM | POA: Insufficient documentation

## 2023-04-09 NOTE — Progress Notes (Unsigned)
[name] [address] [place] [address] Phone: [phone] Subjective:   Bruce Donath, am serving as a scribe for Dr. Antoine Primas.  I'm seeing this patient by the request  of:  Danella Penton, MD  CC: Neck pain and low back pain follow-up  [id number]  03/06/2023 Patient on the x-rays do show that there was a mild displaced xiphoid process fracture noted.  Patient would like to try other conservative therapy, once weekly vitamin D.  Discussed which activities to do and which ones to avoid.  Discussed increasing activity slowly otherwise.  Follow-up again in 6 to 8 weeks otherwise.      Update 04/10/2023 Anna [name] is a 67 y.o. female coming in with complaint of xiphoid process fracture. Patient states that she is doing a lot better. Able to walk further and faster with less pain.         Past Medical History:  Diagnosis Date   Hx of squamous cell carcinoma of skin 04/25/2015   L chest parasternal   No past surgical history on file. Social History   Socioeconomic History   Marital status: Married    Spouse name: Not on file   Number of children: Not on file   Years of education: Not on file   Highest education level: Not on file  Occupational History   Not on file  Tobacco Use   Smoking status: Never   Smokeless tobacco: Never  Substance and Sexual Activity   Alcohol use: Not on file   Drug use: Never   Sexual activity: Not on file  Other Topics Concern   Not on file  Social History Narrative   Not on file   Social Determinants of Health   Financial Resource Strain: Not on file  Food Insecurity: Not on file  Transportation Needs: Not on file  Physical Activity: Not on file  Stress: Not on file  Social Connections: Not on file   Allergies  Allergen Reactions   Ciprofloxacin Rash   Family History  Problem Relation Age of Onset   Breast cancer Neg Hx     Current Outpatient Medications (Endocrine  & Metabolic):    alendronate (FOSAMAX) 70 MG tablet, Take by mouth.    Current Outpatient Medications (Analgesics):    meloxicam (MOBIC) 7.5 MG tablet, Take 7.5 mg by mouth daily.   Current Outpatient Medications (Other):    calcium acetate (PHOSLO) 667 MG capsule, Take by mouth 1 day or 1 dose.   cholecalciferol (VITAMIN D3) 25 MCG (1000 UT) tablet, Take 1,000 Units by mouth daily.   citalopram (CELEXA) 20 MG tablet, Take 20 mg by mouth daily.   Vitamin D, Ergocalciferol, (DRISDOL) 1.25 MG (50000 UNIT) CAPS capsule, Take 1 capsule (50,000 Units total) by mouth every 7 (seven) days.   Reviewed prior external information including notes and imaging from  primary care provider As well as notes that were available from care everywhere and other healthcare systems.  Past medical history, social, surgical and family history all reviewed in electronic medical record.  No pertanent information unless stated regarding to the chief complaint.   Review of Systems:  No headache, visual changes, nausea, vomiting, diarrhea, constipation, dizziness, abdominal pain, skin rash, fevers, chills, night sweats, weight loss, swollen lymph nodes, body aches, joint swelling, chest pain, shortness of breath, mood changes. POSITIVE muscle aches  Objective  Blood pressure 120/78, pulse 80, height 5\' 7"  (1.702 m), weight 131 lb (59.4 kg),  SpO2 96%.   General: No apparent distress alert and oriented x3 mood and affect normal, dressed appropriately.  HEENT: Pupils equal, extraocular movements intact  Respiratory: Patient's speak in full sentences and does not appear short of breath  Cardiovascular: No lower extremity edema, non tender, no erythema  Low back does have some loss of lordosis noted.  Neck exam does have some limited sidebending bilaterally.  Mild loss of extension of the neck noted for the last 5 degrees.  Osteopathic findings C2 flexed rotated and side bent right C4 flexed rotated and side bent  left C6 flexed rotated and side bent left T3 extended rotated and side bent right inhaled third rib      Impression and Recommendations:     The above documentation has been reviewed and is accurate and complete Judi Saa, DO

## 2023-04-10 ENCOUNTER — Encounter: Payer: Self-pay | Admitting: Family Medicine

## 2023-04-10 ENCOUNTER — Ambulatory Visit (INDEPENDENT_AMBULATORY_CARE_PROVIDER_SITE_OTHER): Payer: Medicare Other | Admitting: Family Medicine

## 2023-04-10 VITALS — BP 120/78 | HR 80 | Ht 67.0 in | Wt 131.0 lb

## 2023-04-10 DIAGNOSIS — M9908 Segmental and somatic dysfunction of rib cage: Secondary | ICD-10-CM | POA: Diagnosis not present

## 2023-04-10 DIAGNOSIS — S2224XA Fracture of xiphoid process, initial encounter for closed fracture: Secondary | ICD-10-CM

## 2023-04-10 DIAGNOSIS — M542 Cervicalgia: Secondary | ICD-10-CM | POA: Diagnosis not present

## 2023-04-10 DIAGNOSIS — M9901 Segmental and somatic dysfunction of cervical region: Secondary | ICD-10-CM | POA: Diagnosis not present

## 2023-04-10 DIAGNOSIS — M9902 Segmental and somatic dysfunction of thoracic region: Secondary | ICD-10-CM | POA: Diagnosis not present

## 2023-04-10 NOTE — Assessment & Plan Note (Signed)
I believe fully healed at this time.  No other changes in management.

## 2023-04-10 NOTE — Assessment & Plan Note (Signed)
Decision today to treat with OMT was based on Physical Exam  After verbal consent patient was treated with HVLA, ME, FPR techniques in cervical, thoracic, rib,areas, all areas are chronic   Patient tolerated the procedure well with improvement in symptoms  Patient given exercises, stretches and lifestyle modifications  See medications in patient instructions if given  Patient will follow up in 4-8 weeks

## 2023-04-10 NOTE — Patient Instructions (Signed)
I think we avoid the trail See me in 6-8 weeks

## 2023-05-28 ENCOUNTER — Ambulatory Visit: Payer: Medicare Other | Admitting: Family Medicine

## 2023-05-28 ENCOUNTER — Encounter: Payer: Self-pay | Admitting: Family Medicine

## 2023-05-28 VITALS — BP 122/76 | HR 74 | Ht 67.0 in | Wt 140.0 lb

## 2023-05-28 DIAGNOSIS — M9903 Segmental and somatic dysfunction of lumbar region: Secondary | ICD-10-CM

## 2023-05-28 DIAGNOSIS — M542 Cervicalgia: Secondary | ICD-10-CM

## 2023-05-28 DIAGNOSIS — M9904 Segmental and somatic dysfunction of sacral region: Secondary | ICD-10-CM | POA: Diagnosis not present

## 2023-05-28 DIAGNOSIS — M9908 Segmental and somatic dysfunction of rib cage: Secondary | ICD-10-CM

## 2023-05-28 DIAGNOSIS — M9902 Segmental and somatic dysfunction of thoracic region: Secondary | ICD-10-CM | POA: Diagnosis not present

## 2023-05-28 DIAGNOSIS — M9901 Segmental and somatic dysfunction of cervical region: Secondary | ICD-10-CM

## 2023-05-28 NOTE — Patient Instructions (Signed)
Good to see you! Try to hug a pillow See you again in 6-8 weeks

## 2023-05-28 NOTE — Assessment & Plan Note (Signed)
Neck exam does have some loss lordosis noted.  Some tenderness to palpation noted.  Distal discussed sleeping position that I think will be more beneficial.  Discussed icing regimen and home exercises.  No change in medications.  Follow-up again in 6 to 8 weeks.

## 2023-05-28 NOTE — Progress Notes (Signed)
  Anna [name] [address] [place] [address] Phone: [phone] Subjective:   Anna [name], am serving as a scribe for Dr. Antoine [name].  I'm seeing this patient by the request  of:  Anna Penton, MD  CC: back and neck pain follow up   [id number]  Anna [name] is a 66 y.o. female coming in with complaint of back and neck pain. OMT 04/04/2023. Patient states same per usual. No new concerns. Ache in shoulders and arms.  Medications patient has been prescribed: Vit d  Taking:         Reviewed prior external information including notes and imaging from previsou exam, outside providers and external EMR if available.   As well as notes that were available from care everywhere and other healthcare systems.  Past medical history, social, surgical and family history all reviewed in electronic medical record.  No pertanent information unless stated regarding to the chief complaint.   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 POSITIVE muscle aches  Objective  Blood pressure 122/76, pulse 74, height 5\' 7"  (1.702 m), weight 140 lb (63.5 kg), SpO2 96%.   General: No apparent distress alert and oriented x3 mood and affect normal, dressed appropriately.  HEENT: Pupils equal, extraocular movements intact  Respiratory: Patient's speak in full sentences and does not appear short of breath  Cardiovascular: No lower extremity edema, non tender, no erythema  MSK:  Back does have some loss lordosis noted.  Neck exam does have significant tightness  noted bilaterally.  Patient has negative Spurling's maneuver.  Osteopathic findings  C2 flexed rotated and side bent right C7 flexed rotated and side bent left T3 extended rotated and side bent right inhaled rib L1 flexed rotated and side bent right Sacrum right on right     Assessment and Plan:  Neck pain Neck exam does have some loss lordosis noted.  Some tenderness to palpation noted.  Distal discussed sleeping position that I think will be more beneficial.  Discussed icing regimen and home exercises.  No change in medications.  Follow-up again in 6 to 8 weeks.    Nonallopathic problems  Decision today to treat with OMT was based on Physical Exam  After verbal consent patient was treated with HVLA, ME, FPR techniques in cervical, rib, thoracic, lumbar, and sacral  areas  Patient tolerated the procedure well with improvement in symptoms  Patient given exercises, stretches and lifestyle modifications  See medications in patient instructions if given  Patient will follow up in 4-8 weeks     The above documentation has been reviewed and is accurate and complete Anna Saa, DO         Note: This dictation was prepared with Dragon dictation along with smaller phrase technology. Any transcriptional errors that result from this process are unintentional.

## 2023-07-16 NOTE — Progress Notes (Unsigned)
  [name] [address] [place] [address] Phone: [phone] Subjective:   Anna [name], am serving as a scribe for Dr. Antoine Primas.  I'm seeing this patient by the request  of:  Danella Penton, MD  CC: back and neck pain follow up   BJY:[id number]  Anna [name] is a 68 y.o. female coming in with complaint of back and neck pain. OMT 05/28/2023. Patient states that she continues to have pain in neck and scapula at night.   Medications patient has been prescribed: Vit D         Reviewed prior external information including notes and imaging from previsou exam, outside providers and external EMR if available.   As well as notes that were available from care everywhere and other healthcare systems.  Past medical history, social, surgical and family history all reviewed in electronic medical record.  No pertanent information unless stated regarding to the chief complaint.   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 POSITIVE muscle aches  Objective  Blood pressure 110/76, pulse 91, height 5\' 7"  (1.702 m), weight 138 lb (62.6 kg), SpO2 99%.   General: No apparent distress alert and oriented x3 mood and affect normal, dressed appropriately.  HEENT: Pupils equal, extraocular movements intact  Respiratory: Patient's speak in full sentences and does not appear short of breath  Cardiovascular: No lower extremity edema, non tender, no erythema  MSK:  Back does have some loss of lordosis tightness with of the lower back minorly.  Significant  tightness noted in the neck at the C5-C7 level.  Worsening seen patient.  Even midline tenderness noted at the T3 area.  This is significantly worse.        Assessment and Plan:  Neck pain Worsening pain at this time.  Starting to affect daily activities and wake her up at night.  Failed all conservative therapy at this time.  X-rays have shown degenerative disc disease and does have some compression deformities noted of the upper thoracic spine.  Patient has had a history of squamous cell carcinoma of the skin that was on the chest previously.  Abnormal compression is noted of the thoracic spine and do feel advanced imaging is warranted at this time.  Significantly worse on exam as well as some midline tenderness.  Will get MRIs to further evaluate.        The above documentation has been reviewed and is accurate and complete Anna Saa, DO          Note: This dictation was prepared with Dragon dictation along with smaller phrase technology. Any transcriptional errors that result from this process are unintentional.

## 2023-07-17 ENCOUNTER — Encounter: Payer: Self-pay | Admitting: Family Medicine

## 2023-07-17 ENCOUNTER — Ambulatory Visit (INDEPENDENT_AMBULATORY_CARE_PROVIDER_SITE_OTHER): Payer: Medicare Other | Admitting: Family Medicine

## 2023-07-17 VITALS — BP 110/76 | HR 91 | Ht 67.0 in | Wt 138.0 lb

## 2023-07-17 DIAGNOSIS — M546 Pain in thoracic spine: Secondary | ICD-10-CM | POA: Diagnosis not present

## 2023-07-17 DIAGNOSIS — M542 Cervicalgia: Secondary | ICD-10-CM | POA: Diagnosis not present

## 2023-07-17 NOTE — Assessment & Plan Note (Signed)
Worsening pain at this time.  Starting to affect daily activities and wake her up at night.  Failed all conservative therapy at this time.  X-rays have shown degenerative disc disease and does have some compression deformities noted of the upper thoracic spine.  Patient has had a history of squamous cell carcinoma of the skin that was on the chest previously.  Abnormal compression is noted of the thoracic spine and do feel advanced imaging is warranted at this time.  Significantly worse on exam as well as some midline tenderness.  Will get MRIs to further evaluate.

## 2023-07-17 NOTE — Patient Instructions (Signed)
Good to see you MRI cervical and thoracic See me in 6-8 weeks

## 2023-07-18 ENCOUNTER — Encounter: Payer: Self-pay | Admitting: Family Medicine

## 2023-07-27 ENCOUNTER — Ambulatory Visit
Admission: RE | Admit: 2023-07-27 | Discharge: 2023-07-27 | Disposition: A | Discharge status: Home / Self Care | Payer: 59 | Source: Ambulatory Visit | Attending: Family Medicine | Admitting: Family Medicine

## 2023-07-27 DIAGNOSIS — M546 Pain in thoracic spine: Secondary | ICD-10-CM

## 2023-07-27 DIAGNOSIS — M542 Cervicalgia: Secondary | ICD-10-CM

## 2023-08-01 ENCOUNTER — Encounter: Payer: Self-pay | Admitting: Family Medicine

## 2023-08-05 ENCOUNTER — Other Ambulatory Visit: Payer: Self-pay | Admitting: Family Medicine

## 2023-08-05 DIAGNOSIS — M546 Pain in thoracic spine: Secondary | ICD-10-CM

## 2023-08-05 DIAGNOSIS — M542 Cervicalgia: Secondary | ICD-10-CM

## 2023-08-11 ENCOUNTER — Encounter: Payer: Self-pay | Admitting: Family Medicine

## 2023-08-12 ENCOUNTER — Encounter: Payer: Self-pay | Admitting: Family Medicine

## 2023-08-14 ENCOUNTER — Ambulatory Visit
Admission: RE | Admit: 2023-08-14 | Discharge: 2023-08-14 | Disposition: A | Discharge status: Home / Self Care | Payer: 59 | Source: Ambulatory Visit | Attending: Family Medicine | Admitting: Family Medicine

## 2023-08-14 DIAGNOSIS — M542 Cervicalgia: Secondary | ICD-10-CM

## 2023-08-14 DIAGNOSIS — M546 Pain in thoracic spine: Secondary | ICD-10-CM

## 2023-08-14 MED ORDER — TRIAMCINOLONE ACETONIDE 40 MG/ML IJ SUSP (RADIOLOGY)
60.0000 mg | Freq: Once | INTRAMUSCULAR | Status: AC
  Administered 2023-08-14: 60 mg via EPIDURAL

## 2023-08-14 MED ORDER — IOPAMIDOL (ISOVUE-M 300) INJECTION 61%
1.0000 mL | Freq: Once | INTRAMUSCULAR | Status: AC | PRN
Start: 2023-08-14 — End: 2023-08-14
  Administered 2023-08-14: 1 mL via EPIDURAL

## 2023-08-14 NOTE — Discharge Instructions (Signed)

## 2023-08-16 ENCOUNTER — Other Ambulatory Visit: Payer: 59

## 2023-08-28 ENCOUNTER — Ambulatory Visit: Payer: 59 | Admitting: Family Medicine

## 2023-09-24 NOTE — Progress Notes (Unsigned)
 [name] [address] [place] [address] Phone: [phone] Subjective:   Bruce Donath, am serving as a scribe for Dr. Antoine Primas.  I'm seeing this patient by the request  of:  Danella Penton, MD  CC: Neck pain follow-up  BJY:NWGNFAOZHY  07/17/2023 Worsening pain at this time.  Starting to affect daily activities and wake her up at night.  Failed all conservative therapy at this time.  X-rays have shown degenerative disc disease and does have some compression deformities noted of the upper thoracic spine.  Patient has had a history of squamous cell carcinoma of the skin that was on the chest previously.  Abnormal compression is noted of the thoracic spine and do feel advanced imaging is warranted at this time.  Significantly worse on exam as well as some midline tenderness.  Will get MRIs to further evaluate.      Update 09/25/2023 TEQUITA MARRS is a 68 y.o. female coming in with complaint of cervical spine pain. Epidural on 08/14/2023. Epidural was helpful and might be slowly wearing off over the past week. Has some pain in tricep.   Jammed pinky on Saturday. Pain in R 5th metacarpal.      Past Medical History:  Diagnosis Date   Hx of squamous cell carcinoma of skin 04/25/2015   L chest parasternal   No past surgical history on file. Social History   Socioeconomic History   Marital status: Married    Spouse name: Not on file   Number of children: Not on file   Years of education: Not on file   Highest education level: Not on file  Occupational History   Not on file  Tobacco Use   Smoking status: Never   Smokeless tobacco: Never  Substance and Sexual Activity   Alcohol use: Not on file   Drug use: Never   Sexual activity: Not on file  Other Topics Concern   Not on file  Social History Narrative   Not on file   Social Drivers of Health   Financial Resource Strain: Low Risk  (08/21/2023)   Received from [facility]   Overall Financial Resource Strain (CARDIA)    Difficulty of Paying Living Expenses: Not hard at all  Food Insecurity: No Food Insecurity (08/21/2023)   Received from [facility]   Hunger Vital Sign    Worried About Running Out of Food in the Last Year: Never true    Ran Out of Food in the Last Year: Never true  Transportation Needs: Not on file  Physical Activity: Not on file  Stress: Not on file  Social Connections: Not on file   Allergies  Allergen Reactions   Ciprofloxacin Rash   Family History  Problem Relation Age of Onset   Breast cancer Neg Hx        Current Outpatient Medications (Analgesics):    meloxicam (MOBIC) 7.5 MG tablet, Take 7.5 mg by mouth daily.   Current Outpatient Medications (Other):    calcium acetate (PHOSLO) 667 MG capsule, Take by mouth 1 day or 1 dose.   cholecalciferol (VITAMIN D3) 25 MCG (1000 UT) tablet, Take 1,000 Units by mouth daily.   citalopram (CELEXA) 20 MG tablet, Take 20 mg by mouth daily.   Vitamin D, Ergocalciferol, (DRISDOL) 1.25 MG (50000 UNIT) CAPS capsule, Take 1 capsule (50,000 Units total) by mouth every 7 (seven) days.   Reviewed prior external information including notes and imaging  from  primary care provider As well as notes that were available from care everywhere and other healthcare systems.  Past medical history, social, surgical and family history all reviewed in electronic medical record.  No pertanent information unless stated regarding to the chief complaint.   Review of Systems:  No headache, visual changes, nausea, vomiting, diarrhea, constipation, dizziness, abdominal pain, skin rash, fevers, chills, night sweats, weight loss, swollen lymph nodes, body aches, joint swelling, chest pain, shortness of breath, mood changes. POSITIVE muscle aches  Objective  Blood pressure 114/82, pulse 84, height 5\' 7"  (1.702 m), weight 137 lb (62.1 kg), SpO2 98%.   General: No apparent distress  alert and oriented x3 mood and affect normal, dressed appropriately.  HEENT: Pupils equal, extraocular movements intact  Respiratory: Patient's speak in full sentences and does not appear short of breath  Cardiovascular: No lower extremity edema, non tender, no erythema  Neck exam does have some loss lordosis noted.  Patient does have improvement in range of motion.  Mild tightness noted in the shoulder region right greater than left.  Shoulder does not show do any impingement  Right ring finger seems okay but patient's pinky does have some mild swelling over the MCP joint.  Full range of motion noted.    Impression and Recommendations:      The above documentation has been reviewed and is accurate and complete Judi Saa, DO

## 2023-09-25 ENCOUNTER — Ambulatory Visit (INDEPENDENT_AMBULATORY_CARE_PROVIDER_SITE_OTHER): Payer: 59 | Admitting: Family Medicine

## 2023-09-25 ENCOUNTER — Encounter: Payer: Self-pay | Admitting: Family Medicine

## 2023-09-25 VITALS — BP 114/82 | HR 84 | Ht 67.0 in | Wt 137.0 lb

## 2023-09-25 DIAGNOSIS — S63696A Other sprain of right little finger, initial encounter: Secondary | ICD-10-CM | POA: Insufficient documentation

## 2023-09-25 NOTE — Assessment & Plan Note (Signed)
 Discussed buddy taping, with full range of motion do not feel imaging is necessary.  If not improved over 7 to 10 days to come back for further evaluation.

## 2023-09-25 NOTE — Patient Instructions (Signed)
 [place] for finger Buddy tape daily for 1 week Tell hubby all his fault If needed we can do another epidural See you again in 3 months

## 2023-11-28 ENCOUNTER — Encounter: Payer: Self-pay | Admitting: Family Medicine

## 2023-11-28 ENCOUNTER — Other Ambulatory Visit: Payer: Self-pay | Admitting: Family Medicine

## 2023-11-28 MED ORDER — TIZANIDINE HCL 4 MG PO TABS: 4.0000 mg | ORAL_TABLET | Freq: Every day | ORAL | 0 refills | Status: DC

## 2023-12-23 NOTE — Progress Notes (Unsigned)
 [facility] [address] [place] [address] Phone: [phone] Subjective:   Anna [name] am a scribe for Dr. Claudene.   I'm seeing this patient by the request  of:  Cleotilde Oneil FALCON, MD  CC: Right pinky finger pain, neck pain  YEP:Dlagzrupcz  09/25/2023 Discussed buddy taping, with full range of motion do not feel imaging is necessary.  If not improved over 7 to 10 days to come back for further evaluation.      Update 12/30/2023 Anna [name] is a 68 y.o. female coming in with complaint of R pinky finger pain. Patient states she thinks she may need another epidural. The pain is now in her leg. 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 Pinky finger still gets stiff some times.       Past Medical History:  Diagnosis Date   Hx of squamous cell carcinoma of skin 04/25/2015   L chest parasternal   No past surgical history on file. Social History   Socioeconomic History   Marital status: Married    Spouse name: Not on file   Number of children: Not on file   Years of education: Not on file   Highest education level: Not on file  Occupational History   Not on file  Tobacco Use   Smoking status: Never   Smokeless tobacco: Never  Substance and Sexual Activity   Alcohol use: Not on file   Drug use: Never   Sexual activity: Not on file  Other Topics Concern   Not on file  Social History Narrative   Not on file   Social Drivers of Health   Financial Resource Strain: Low Risk  (08/21/2023)   Received from [facility]   Overall Financial Resource Strain (CARDIA)    Difficulty of Paying Living Expenses: Not hard at all  Food Insecurity: No Food Insecurity (08/21/2023)   Received from [facility]   Hunger Vital Sign    Within the past 12 months, you worried that your food would run out before you got the money to buy more.: Never true    Within the past  12 months, the food you bought just didn't last and you didn't have money to get more.: Never true  Transportation Needs: Not on file  Physical Activity: Not on file  Stress: Not on file  Social Connections: Not on file   Allergies  Allergen Reactions   Ciprofloxacin Rash   Family History  Problem Relation Age of Onset   Breast cancer Neg Hx        Current Outpatient Medications (Analgesics):    meloxicam (MOBIC) 7.5 MG tablet, Take 7.5 mg by mouth daily.   Current Outpatient Medications (Other):    calcium acetate (PHOSLO) 667 MG capsule, Take by mouth 1 day or 1 dose.   cholecalciferol (VITAMIN D3) 25 MCG (1000 UT) tablet, Take 1,000 Units by mouth daily.   citalopram (CELEXA) 20 MG tablet, Take 20 mg by mouth daily.   tiZANidine  (ZANAFLEX ) 4 MG tablet, Take 1 tablet (4 mg total) by mouth at bedtime.   Vitamin D , Ergocalciferol , (DRISDOL ) 1.25 MG (50000 UNIT) CAPS capsule, Take 1 capsule (50,000 Units total) by mouth every 7 (seven) days.   Reviewed prior external information including notes and imaging from  primary care provider As well as notes that were available from care everywhere and other healthcare systems.  Past medical history, social, surgical and family history all reviewed in electronic medical record.  No pertanent information unless stated regarding to the chief complaint.   Review of Systems:  No headache, visual changes, nausea, vomiting, diarrhea, constipation, dizziness, abdominal pain, skin rash, fevers, chills, night sweats, weight loss, swollen lymph nodes, body aches, joint swelling, chest pain, shortness of breath, mood changes. POSITIVE muscle aches  Objective  Blood pressure 118/70, pulse 91, height 5' 7 (1.702 m), weight 137 lb (62.1 kg), SpO2 100%.   General: No apparent distress alert and oriented x3 mood and affect normal, dressed appropriately.  HEENT: Pupils equal, extraocular movements intact  Respiratory: Patient's speak in full  sentences and does not appear short of breath  Cardiovascular: No lower extremity edema, non tender, no erythema  Neck exam shows significant loss of lordosis.  Tightness noted with extension of the neck.  Patient lacks last 10 degrees of extension.  Right hip exam shows severe tenderness to palpation over the greater trochanteric area.  Right greater than left.  No severe tenderness with FABER test.  Negative straight leg test.  Mild discomfort noted in the paraspinal musculature of the lumbar spine.   After verbal consent patient was prepped with alcohol swab and with a 21-gauge 2 inch needle injected into the right greater trochanteric area with 2 cc of 0.5% Marcaine and 1 cc of Kenalog  40 mg/mL.  No blood loss.  Band-Aid placed.  Postinjection instructions given    Impression and Recommendations:     The above documentation has been reviewed and is accurate and complete Khrystian Schauf M Danel Studzinski, DO

## 2023-12-30 ENCOUNTER — Ambulatory Visit: Payer: Self-pay | Admitting: Family Medicine

## 2023-12-30 ENCOUNTER — Encounter: Payer: Self-pay | Admitting: Family Medicine

## 2023-12-30 ENCOUNTER — Ambulatory Visit (INDEPENDENT_AMBULATORY_CARE_PROVIDER_SITE_OTHER): Admitting: Family Medicine

## 2023-12-30 ENCOUNTER — Ambulatory Visit (INDEPENDENT_AMBULATORY_CARE_PROVIDER_SITE_OTHER)

## 2023-12-30 VITALS — BP 118/70 | HR 91 | Ht 67.0 in | Wt 137.0 lb

## 2023-12-30 DIAGNOSIS — M545 Low back pain, unspecified: Secondary | ICD-10-CM | POA: Diagnosis not present

## 2023-12-30 DIAGNOSIS — M7061 Trochanteric bursitis, right hip: Secondary | ICD-10-CM

## 2023-12-30 DIAGNOSIS — M542 Cervicalgia: Secondary | ICD-10-CM | POA: Diagnosis not present

## 2023-12-30 DIAGNOSIS — M25551 Pain in right hip: Secondary | ICD-10-CM | POA: Diagnosis not present

## 2023-12-30 NOTE — Patient Instructions (Addendum)
 Xrays today [facility] [phone]  Do prescribed exercises at least 3x a week  See you again in 2-3 months

## 2023-12-30 NOTE — Assessment & Plan Note (Signed)
 Injection given today.  Differential includes lumbar radiculopathy and x-rays ordered today.  Discussed icing regimen and home exercises, which activities to do in which ones to avoid.  Increase activity slowly.  Hip abductor strengthening exercises encouraged.  Follow-up again in 6 to 8 weeks

## 2023-12-30 NOTE — Assessment & Plan Note (Signed)
 Neck pain does show osteophyte formation as well as some protruding disks noted.  We discussed icing regimen and home exercises, which activities to do in which ones to avoid.  Increase activity slowly.  Discussed icing regimen.  Patient has responded well to an epidural previously and do think we should consider this again.  Patient would like us  to order one.  Hopefully patient will respond.  Will hold on any type of osteopathic manipulation at this time.

## 2024-01-01 NOTE — Discharge Instructions (Signed)

## 2024-01-02 ENCOUNTER — Encounter: Payer: Self-pay | Admitting: Family Medicine

## 2024-01-02 ENCOUNTER — Ambulatory Visit
Admission: RE | Admit: 2024-01-02 | Discharge: 2024-01-02 | Disposition: A | Discharge status: Home / Self Care | Source: Ambulatory Visit | Attending: Family Medicine | Admitting: Family Medicine

## 2024-01-02 DIAGNOSIS — M542 Cervicalgia: Secondary | ICD-10-CM

## 2024-01-02 MED ORDER — IOPAMIDOL (ISOVUE-300) INJECTION 61%
10.0000 mL | Freq: Once | INTRAVENOUS | Status: AC | PRN
  Administered 2024-01-02: 3 mL

## 2024-01-02 MED ORDER — TRIAMCINOLONE ACETONIDE 40 MG/ML IJ SUSP (RADIOLOGY)
60.0000 mg | Freq: Once | INTRAMUSCULAR | Status: AC
Start: 2024-01-02 — End: 2024-01-02
  Administered 2024-01-02: 60 mg via EPIDURAL

## 2024-01-15 ENCOUNTER — Ambulatory Visit: Admitting: Dermatology

## 2024-01-15 DIAGNOSIS — W908XXA Exposure to other nonionizing radiation, initial encounter: Secondary | ICD-10-CM

## 2024-01-15 DIAGNOSIS — D229 Melanocytic nevi, unspecified: Secondary | ICD-10-CM

## 2024-01-15 DIAGNOSIS — H61022 Chronic perichondritis of left external ear: Secondary | ICD-10-CM

## 2024-01-15 DIAGNOSIS — L814 Other melanin hyperpigmentation: Secondary | ICD-10-CM

## 2024-01-15 DIAGNOSIS — L570 Actinic keratosis: Secondary | ICD-10-CM

## 2024-01-15 DIAGNOSIS — Z1283 Encounter for screening for malignant neoplasm of skin: Secondary | ICD-10-CM

## 2024-01-15 DIAGNOSIS — H61002 Unspecified perichondritis of left external ear: Secondary | ICD-10-CM

## 2024-01-15 DIAGNOSIS — L578 Other skin changes due to chronic exposure to nonionizing radiation: Secondary | ICD-10-CM

## 2024-01-15 DIAGNOSIS — I781 Nevus, non-neoplastic: Secondary | ICD-10-CM

## 2024-01-15 NOTE — Progress Notes (Signed)
 Follow-Up Visit   Subjective  Anna [name] is a 68 y.o. female who presents for the following: Skin Cancer Screening and Full Body Skin Exam Hx of aks Patient concerned with spot at left ear she noticed a month ago and some spots on nose.  The patient presents for Total-Body Skin Exam (TBSE) for skin cancer screening and mole check. The patient has spots, moles and lesions to be evaluated, some may be new or changing and the patient may have concern these could be cancer.  The following portions of the chart were reviewed this encounter and updated as appropriate: medications, allergies, medical history  Review of Systems:  No other skin or systemic complaints except as noted in HPI or Assessment and Plan.  Objective  Well appearing patient in no apparent distress; mood and affect are within normal limits.  A full examination was performed including scalp, head, eyes, ears, nose, lips, neck, chest, axillae, abdomen, back, buttocks, bilateral upper extremities, bilateral lower extremities, hands, feet, fingers, toes, fingernails, and toenails. All findings within normal limits unless otherwise noted below.   Relevant physical exam findings are noted in the Assessment and Plan. Telangiectasias at chest photos        Left Ear Scaly pink  plaque overlying prominent cartilage at the left ear See photo   nose x 1 Erythematous thin papules/macules with gritty scale.   Assessment & Plan   SKIN CANCER SCREENING PERFORMED TODAY.  ACTINIC DAMAGE - Chronic condition, secondary to cumulative UV/sun exposure - diffuse scaly erythematous macules with underlying dyspigmentation - Recommend daily broad spectrum sunscreen SPF 30+ to sun-exposed areas, reapply every 2 hours as needed.  - Staying in the shade or wearing long sleeves, sun glasses (UVA+UVB protection) and wide brim hats (4-inch brim around the entire circumference of the hat) are also recommended for sun protection.  -  Call for new or changing lesions.  LENTIGINES, SEBORRHEIC KERATOSES, HEMANGIOMAS - Benign normal skin lesions - Benign-appearing - Call for any changes  MELANOCYTIC NEVI - Tan-brown and/or pink-flesh-colored symmetric macules and papules - Benign appearing on exam today - Observation - Call clinic for new or changing moles - Recommend daily use of broad spectrum spf 30+ sunscreen to sun-exposed areas.   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  Observation.  Call clinic for new or changing lesions.  Recommend daily use of broad spectrum spf 30+ sunscreen to sun-exposed areas.  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  Trauma or pressure from sleeping on the ear or from cell phone use and sun damage may be exacerbating factors.  Treatment may include using a C-shaped airplane neck pillow for sleeping on the side of the head so no pressure is on the ear.  Other treatments include topical or intralesional steroids; liquid nitrogen or laser destruction; shave removal or excision.  The condition can be difficult to treat and persist or recur despite treatment.  Discussed chronic condition  Can be treated with LN2 but may recur with treatment including if surgical removed could recur  Let us  know if anything changes   Destruction of lesion - Left Ear Complexity: simple  Destruction method: cryotherapy   Informed consent: discussed and consent obtained   Timeout:  patient name, date of birth, surgical site, and procedure verified Lesion destroyed using liquid  nitrogen: Yes   Region frozen until ice ball extended beyond lesion: Yes   Outcome: patient tolerated procedure well with no complications   Post-procedure details: wound care instructions given    ACTINIC KERATOSIS nose x 1 Actinic keratoses are precancerous spots that appear secondary to cumulative UV radiation exposure/sun exposure over time. They are chronic with expected duration over 1 year. A portion of actinic keratoses will progress to squamous cell carcinoma of the skin. It is not possible to reliably predict which spots will progress to skin cancer and so treatment is recommended to prevent development of skin cancer.  Recommend daily broad spectrum sunscreen SPF 30+ to sun-exposed areas, reapply every 2 hours as needed.  Recommend staying in the shade or wearing long sleeves, sun glasses (UVA+UVB protection) and wide brim hats (4-inch brim around the entire circumference of the hat). Call for new or changing lesions. Destruction of lesion - nose x 1 Complexity: simple   Destruction method: cryotherapy   Informed consent: discussed and consent obtained   Timeout:  patient name, date of birth, surgical site, and procedure verified Lesion destroyed using liquid nitrogen: Yes   Region frozen until ice ball extended beyond lesion: Yes   Outcome: patient tolerated procedure well with no complications   Post-procedure details: wound care instructions given    Return in about 1 year (around 01/14/2025) for TBSE.  IEleanor Blush, CMA, am acting as scribe for Alm Rhyme, MD.   Documentation: I have reviewed the above documentation for accuracy and completeness, and I agree with the above.  Alm Rhyme, MD

## 2024-01-15 NOTE — Patient Instructions (Addendum)
 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  Trauma or pressure from sleeping on the ear or from cell phone use and sun damage may be exacerbating factors.  Treatment may include using a C-shaped airplane neck pillow for sleeping on the side of the head so no pressure is on the ear.  Other treatments include topical or intralesional steroids; liquid nitrogen or laser destruction; shave removal or excision.  The condition can be difficult to treat and persist or recur despite treatment.   Actinic keratoses are precancerous spots that appear secondary to cumulative UV radiation exposure/sun exposure over time. They are chronic with expected duration over 1 year. A portion of actinic keratoses will progress to squamous cell carcinoma of the skin. It is not possible to reliably predict which spots will progress to skin cancer and so treatment is recommended to prevent development of skin cancer.  Recommend daily broad spectrum sunscreen SPF 30+ to sun-exposed areas, reapply every 2 hours as needed.  Recommend staying in the shade or wearing long sleeves, sun glasses (UVA+UVB protection) and wide brim hats (4-inch brim around the entire circumference of the hat). Call for new or changing lesions.   Cryotherapy Aftercare  Wash gently with soap and water everyday.   Apply Vaseline and Band-Aid daily until healed.   Seborrheic Keratosis  What causes seborrheic keratoses? Seborrheic keratoses are harmless, common skin growths that first appear during adult life.  As time goes by, more growths appear.  Some people may develop a large number of them.  Seborrheic keratoses appear on both covered and uncovered body parts.  They are not caused by sunlight.  The tendency to develop seborrheic keratoses can be inherited.  They vary in color from skin-colored to gray,  brown, or even black.  They can be either smooth or have a rough, warty surface.   Seborrheic keratoses are superficial and look as if they were stuck on the skin.  Under the microscope this type of keratosis looks like layers upon layers of skin.  That is why at times the top layer may seem to fall off, but the rest of the growth remains and re-grows.    Treatment Seborrheic keratoses do not need to be treated, but can easily be removed in the office.  Seborrheic keratoses often cause symptoms when they rub on clothing or jewelry.  Lesions can be in the way of shaving.  If they become inflamed, they can cause itching, soreness, or burning.  Removal of a seborrheic keratosis can be accomplished by freezing, burning, or surgery. If any spot bleeds, scabs, or grows rapidly, please return to have it checked, as these can be an indication of a skin cancer.   Melanoma ABCDEs  Melanoma is the most dangerous type of skin cancer, and is the leading cause of death from skin disease.  You are more likely to develop melanoma if you: Have light-colored skin, light-colored eyes, or red or blond hair Spend a lot of time in the sun Tan regularly, either outdoors or in a tanning bed Have had blistering sunburns, especially during childhood Have a close family member who has had a melanoma Have atypical moles or large birthmarks  Early detection of melanoma is key since treatment is typically straightforward and cure rates are extremely high if we catch it early.   The first sign of melanoma is often a change in a  mole or a new dark spot.  The ABCDE system is a way of remembering the signs of melanoma.  A for asymmetry:  The two halves do not match. B for border:  The edges of the growth are irregular. C for color:  A mixture of colors are present instead of an even brown color. D for diameter:  Melanomas are usually (but not always) greater than 6mm - the size of a pencil eraser. E for evolution:  The spot  keeps changing in size, shape, and color.  Please check your skin once per month between visits. You can use a small mirror in front and a large mirror behind you to keep an eye on the back side or your body.   If you see any new or changing lesions before your next follow-up, please call to schedule a visit.  Please continue daily skin protection including broad spectrum sunscreen SPF 30+ to sun-exposed areas, reapplying every 2 hours as needed when you're outdoors.   Staying in the shade or wearing long sleeves, sun glasses (UVA+UVB protection) and wide brim hats (4-inch brim around the entire circumference of the hat) are also recommended for sun protection.    Due to recent changes in healthcare laws, you may see results of your pathology and/or laboratory studies on MyChart before the doctors have had a chance to review them. We understand that in some cases there may be results that are confusing or concerning to you. Please understand that not all results are received at the same time and often the doctors may need to interpret multiple results in order to provide you with the best plan of care or course of treatment. Therefore, we ask that you please give us  2 business days to thoroughly review all your results before contacting the office for clarification. Should we see a critical lab result, you will be contacted sooner.   If You Need Anything After Your Visit  If you have any questions or concerns for your doctor, please call our main line at [phone] and press option 4 to reach your doctor's medical assistant. If no one answers, please leave a voicemail as directed and we will return your call as soon as possible. Messages left after 4 pm will be answered the following business day.   You may also send us  a message via MyChart. We typically respond to MyChart messages within 1-2 business days.  For prescription refills, please ask your pharmacy to contact our office. Our fax  number is [phone].  If you have an urgent issue when the clinic is closed that cannot wait until the next business day, you can page your doctor at the number below.    Please note that while we do our best to be available for urgent issues outside of office hours, we are not available 24/7.   If you have an urgent issue and are unable to reach us , you may choose to seek medical care at your doctor's office, retail clinic, urgent care center, or emergency room.  If you have a medical emergency, please immediately call 911 or go to the emergency department.  Pager Numbers  - Dr. Bary Likes: [phone]  - Dr. Annette Barters: [phone]  - Dr. Felipe Horton: [phone]   In the event of inclement weather, please call our main line at [phone] for an update on the status of any delays or closures.  Dermatology Medication Tips: Please keep the boxes that topical medications come in in order to help keep track  of the instructions about where and how to use these. Pharmacies typically print the medication instructions only on the boxes and not directly on the medication tubes.   If your medication is too expensive, please contact our office at [phone] option 4 or send us  a message through MyChart.   We are unable to tell what your co-pay for medications will be in advance as this is different depending on your insurance coverage. However, we may be able to find a substitute medication at lower cost or fill out paperwork to get insurance to cover a needed medication.   If a prior authorization is required to get your medication covered by your insurance company, please allow us  1-2 business days to complete this process.  Drug prices often vary depending on where the prescription is filled and some pharmacies may offer cheaper prices.  The website www.goodrx.com contains coupons for medications through different pharmacies. The prices here do not account for what the cost may be with help  from insurance (it may be cheaper with your insurance), but the website can give you the price if you did not use any insurance.  - You can print the associated coupon and take it with your prescription to the pharmacy.  - You may also stop by our office during regular business hours and pick up a GoodRx coupon card.  - If you need your prescription sent electronically to a different pharmacy, notify our office through [facility] or by phone at [phone] option 4.     Si Usted Necesita Algo Despus de Su Visita  Tambin puede enviarnos un mensaje a travs de Clinical cytogeneticist. Por lo general respondemos a los mensajes de MyChart en el transcurso de 1 a 2 das hbiles.  Para renovar recetas, por favor pida a su farmacia que se ponga en contacto con nuestra oficina. Franz Jacks de fax es [place] [phone].  Si tiene un asunto urgente cuando la clnica est cerrada y que no puede esperar hasta el siguiente da hbil, puede llamar/localizar a su doctor(a) al nmero que aparece a continuacin.   Por favor, tenga en cuenta que aunque hacemos todo lo posible para estar disponibles para asuntos urgentes fuera del horario de [place], no estamos disponibles las 24 horas del da, los 7 [address]  [address] de la [place].   Si tiene un problema urgente y no puede comunicarse con nosotros, puede optar por buscar atencin mdica  en el consultorio de su doctor(a), en una clnica privada, en un centro de atencin urgente o en una sala de emergencias.  Si tiene Engineer, drilling, por favor llame inmediatamente al 911 o vaya a la sala de emergencias.  Nmeros de bper  - Dr. Bary Likes: [phone]  - Dra. Annette Barters: [phone]  - Dr. Felipe Horton: [phone]   En caso de inclemencias del tiempo, por favor llame a Lajuan Pila principal al [phone] para una actualizacin sobre el [place] de cualquier retraso o cierre.  Consejos para la medicacin en dermatologa: Por favor, guarde las cajas en las que vienen los  medicamentos de uso tpico para ayudarle a seguir las instrucciones sobre dnde y cmo usarlos. Las farmacias generalmente imprimen las instrucciones del medicamento slo en las cajas y no directamente en los tubos del [place].   Si su medicamento es muy caro, por favor, pngase en contacto con Bettyjane Brunet llamando al [phone] y presione la opcin 4 o envenos un mensaje a travs de Clinical cytogeneticist.   No podemos decirle cul ser su copago por los medicamentos  por adelantado ya que esto es diferente dependiendo de la cobertura de su seguro. Sin embargo, es posible que podamos encontrar un medicamento sustituto a Audiological scientist un formulario para que el seguro cubra el medicamento que se considera necesario.   Si se requiere una autorizacin previa para que su compaa de seguros [place] su medicamento, por favor permtanos de 1 a 2 das hbiles para completar este proceso.  Los precios de los medicamentos varan con frecuencia dependiendo del Environmental consultant de dnde se surte la receta y alguna farmacias pueden ofrecer precios ms baratos.  El sitio web www.goodrx.com tiene cupones para medicamentos de Health and safety inspector. Los precios aqu no tienen en cuenta lo que podra costar con la ayuda del seguro (puede ser ms barato con su seguro), pero el sitio web puede darle el precio si no utiliz Tourist information centre manager.  - Puede imprimir el cupn correspondiente y llevarlo con su receta a la farmacia.  - Tambin puede pasar por nuestra oficina durante el horario de atencin regular y Education officer, museum una tarjeta de cupones de GoodRx.  - Si necesita que su receta se enve electrnicamente a una farmacia diferente, informe a nuestra oficina a travs de MyChart de [place] o por telfono llamando al [phone] y presione la opcin 4.

## 2024-01-21 ENCOUNTER — Encounter: Payer: Self-pay | Admitting: Dermatology

## 2024-02-13 ENCOUNTER — Other Ambulatory Visit: Payer: Self-pay | Admitting: Obstetrics and Gynecology

## 2024-02-13 DIAGNOSIS — Z1231 Encounter for screening mammogram for malignant neoplasm of breast: Secondary | ICD-10-CM

## 2024-03-05 NOTE — Progress Notes (Signed)
 [name] [address] [place] [address] Phone: [phone] Subjective:   ISusannah [name], am serving as a scribe for Dr. Arthea Claudene.  I'm seeing this patient by the request  of:  Anna Oneil FALCON, MD  CC: Neck back and hip pain  YEP:Dlagzrupcz  12/30/2023 Injection given today.  Differential includes lumbar radiculopathy and x-rays ordered today.  Discussed icing regimen and home exercises, which activities to do in which ones to avoid.  Increase activity slowly.  Hip abductor strengthening exercises encouraged.  Follow-up again in 6 to 8 weeks     Neck pain does show osteophyte formation as well as some protruding disks noted.  We discussed icing regimen and home exercises, which activities to do in which ones to avoid.  Increase activity slowly.  Discussed icing regimen.  Patient has responded well to an epidural previously and do think we should consider this again.  Patient would like us  to order one.  Hopefully patient will respond.  Will hold on any type of osteopathic manipulation at this time.      Update 03/10/2024 Anna [name] is a 68 y.o. female coming in with complaint of cervical, lumbar, and R hip pain. Epidural 01/02/2024. Patient states epidural on ly helped for a short time. Still hurting with pain radiating down from neck to shoulders and elbow, bilaterally. Constant ache.      Past Medical History:  Diagnosis Date   Hx of squamous cell carcinoma of skin 04/25/2015   L chest parasternal   No past surgical history on file. Social History   Socioeconomic History   Marital status: Married    Spouse name: Not on file   Number of children: Not on file   Years of education: Not on file   Highest education level: Not on file  Occupational History   Not on file  Tobacco Use   Smoking status: Never   Smokeless tobacco: Never  Substance and Sexual Activity   Alcohol use: Not on file   Drug use: Never   Sexual activity:  Not on file  Other Topics Concern   Not on file  Social History Narrative   Not on file   Social Drivers of Health   Financial Resource Strain: Low Risk  (08/21/2023)   Received from [facility]   Overall Financial Resource Strain (CARDIA)    Difficulty of Paying Living Expenses: Not hard at all  Food Insecurity: No Food Insecurity (08/21/2023)   [facility] from [facility]   Hunger Vital Sign    Within the past 12 months, you worried that your food would run out before you got the money to buy more.: Never true    Within the past 12 months, the food you bought just didn't last and you didn't have money to get more.: Never true  Transportation Needs: Not on file  Physical Activity: Not on file  Stress: Not on file  Social Connections: Not on file   Allergies  Allergen Reactions   Ciprofloxacin Rash   Family History  Problem Relation Age of Onset   Breast cancer Neg Hx        Current Outpatient Medications (Analgesics):    meloxicam (MOBIC) 7.5 MG tablet, Take 7.5 mg by mouth daily.   Current Outpatient Medications (Other):    calcium acetate (PHOSLO) 667 MG capsule, Take by mouth 1 day or 1 dose.   cholecalciferol (VITAMIN D3) 25 MCG (1000 UT) tablet,  Take 1,000 Units by mouth daily.   citalopram (CELEXA) 20 MG tablet, Take 20 mg by mouth daily.   tiZANidine  (ZANAFLEX ) 4 MG tablet, Take 1 tablet (4 mg total) by mouth at bedtime.   Vitamin D , Ergocalciferol , (DRISDOL ) 1.25 MG (50000 UNIT) CAPS capsule, Take 1 capsule (50,000 Units total) by mouth every 7 (seven) days.   Reviewed prior external information including notes and imaging from  primary care provider As well as notes that were available from care everywhere and other healthcare systems.  Past medical history, social, surgical and family history all reviewed in electronic medical record.  No pertanent information unless stated regarding to the chief complaint.   Review of  Systems:  No headache, visual changes, nausea, vomiting, diarrhea, constipation, dizziness, abdominal pain, skin rash, fevers, chills, night sweats, weight loss, swollen lymph nodes, body aches, joint swelling, chest pain, shortness of breath, mood changes. POSITIVE muscle aches  Objective  Blood pressure 112/82, height 5' 7 (1.702 m), weight 140 lb (63.5 kg).   General: No apparent distress alert and oriented x3 mood and affect normal, dressed appropriately.  HEENT: Pupils equal, extraocular movements intact  Respiratory: Patient's speak in full sentences and does not appear short of breath  Cardiovascular: No lower extremity edema, non tender, no erythema  Low back exam shows tenderness noted but very mild overall.  Does have some loss of lordosis.  Severe tenderness to palpation over the greater trochanteric area on the right side. Neck exam shows patient does have some limited range of motion still noted.  Some crepitus noted.  Positive Spurling's with radicular symptoms and more of the C7 distribution.   After verbal consent patient was prepped with alcohol swab and with a 21-gauge 2 inch needle injected into the right greater trochanteric area with 2 cc of 0.5% Marcaine and 1 cc of Kenalog  40 mg/mL.  No blood loss.  Band-Aid placed.  Postinjection instructions given   Impression and Recommendations:    The above documentation has been reviewed and is accurate and complete Anna Deis M [name], DO

## 2024-03-10 ENCOUNTER — Ambulatory Visit (INDEPENDENT_AMBULATORY_CARE_PROVIDER_SITE_OTHER): Admitting: Family Medicine

## 2024-03-10 ENCOUNTER — Encounter: Payer: Self-pay | Admitting: Family Medicine

## 2024-03-10 VITALS — BP 112/82 | Ht 67.0 in | Wt 140.0 lb

## 2024-03-10 DIAGNOSIS — M542 Cervicalgia: Secondary | ICD-10-CM | POA: Diagnosis not present

## 2024-03-10 DIAGNOSIS — M7061 Trochanteric bursitis, right hip: Secondary | ICD-10-CM

## 2024-03-10 NOTE — Patient Instructions (Addendum)
 Injections in R hip today Good to see you! See you again in 8 weeks No tattoos until I see you  [facility] [phone] for epidural Referral to Neurosurgery so you have options

## 2024-03-10 NOTE — Assessment & Plan Note (Addendum)
 Chronic problem, discussed icing regimen and home exercises, discussed which activities to do and which ones to avoid.  Would like to consider 1 more epidural but at the same time we will refer patient to neurosurgery to discuss what surgical intervention would look like.  Patient is having some radicular symptoms with Spurling's noted.  Increase activity slowly.  Follow-up again in 6 to 8 weeks otherwise.

## 2024-03-10 NOTE — Assessment & Plan Note (Signed)
 Was completely better for some time but then did a significant amount of hiking in [place] .  May not been wearing the best shoes available.  Discussed icing regimen and home exercises, increase activity slowly.  Follow-up again in 6 to 8 weeks otherwise.

## 2024-03-11 ENCOUNTER — Encounter: Payer: Self-pay | Admitting: Family Medicine

## 2024-03-16 NOTE — Discharge Instructions (Signed)

## 2024-03-17 ENCOUNTER — Ambulatory Visit
Admission: RE | Admit: 2024-03-17 | Discharge: 2024-03-17 | Disposition: A | Discharge status: Home / Self Care | Source: Ambulatory Visit | Attending: Family Medicine | Admitting: Family Medicine

## 2024-03-17 DIAGNOSIS — M542 Cervicalgia: Secondary | ICD-10-CM

## 2024-03-17 MED ORDER — TRIAMCINOLONE ACETONIDE 40 MG/ML IJ SUSP (RADIOLOGY)
60.0000 mg | Freq: Once | INTRAMUSCULAR | Status: AC
  Administered 2024-03-17: 60 mg via EPIDURAL

## 2024-03-17 MED ORDER — IOPAMIDOL (ISOVUE-300) INJECTION 61%
3.0000 mL | Freq: Once | INTRAVENOUS | Status: AC | PRN
  Administered 2024-03-17: 3 mL

## 2024-03-24 ENCOUNTER — Encounter: Payer: Self-pay | Admitting: Neurosurgery

## 2024-03-24 NOTE — Progress Notes (Unsigned)
 Referring Physician:  Claudene Arthea HERO, DO [address] [place],  [place] [address]  Primary Physician:  Anna Oneil FALCON, MD  History of Present Illness: 03/24/2024 [name] is here today with a chief complaint of ***  Neck pain    Duration: *** Location: *** Quality: *** Severity: ***  Precipitating: aggravated by *** Modifying factors: made better by *** Weakness: none Timing: *** Bowel/Bladder Dysfunction: none  Conservative measures:  Physical therapy: *** has not participated in  Multimodal medical therapy including regular antiinflammatories: *** Tizanidine , Meloxicam Injections: 03/17/2024 left C7-T1 ESI 01/02/2024 Left C7-T1 ESI 08/14/2023 Right C7-T1 ESI  Past Surgery: ***none  Anna [name] has ***no symptoms of cervical myelopathy.  The symptoms are causing a significant impact on the patient's life.   I have utilized the care everywhere function in epic to review the outside records available from external health systems.   Review of Systems:  A 10 point review of systems is negative, except for the pertinent positives and negatives detailed in the HPI.  Past Medical History: Past Medical History:  Diagnosis Date   Hx of squamous cell carcinoma of skin 04/25/2015   L chest parasternal    Past Surgical History: No past surgical history on file.  Allergies: Allergies as of 03/26/2024 - Review Complete 03/10/2024  Allergen Reaction Noted   Ciprofloxacin Rash 11/06/2017    Medications:  Current Outpatient Medications:    calcium acetate (PHOSLO) 667 MG capsule, Take by mouth 1 day or 1 dose., Disp: , Rfl:    cholecalciferol (VITAMIN D3) 25 MCG (1000 UT) tablet, Take 1,000 Units by mouth daily., Disp: , Rfl:    citalopram (CELEXA) 20 MG tablet, Take 20 mg by mouth daily., Disp: , Rfl:    meloxicam (MOBIC) 7.5 MG tablet, Take 7.5 mg by mouth daily., Disp: , Rfl:    tiZANidine  (ZANAFLEX ) 4 MG tablet, Take 1 tablet (4 mg  total) by mouth at bedtime., Disp: 30 tablet, Rfl: 0   Vitamin D , Ergocalciferol , (DRISDOL ) 1.25 MG (50000 UNIT) CAPS capsule, Take 1 capsule (50,000 Units total) by mouth every 7 (seven) days., Disp: 12 capsule, Rfl: 0  Social History: Social History   Tobacco Use   Smoking status: Never   Smokeless tobacco: Never  Substance Use Topics   Drug use: Never    Family Medical History: Family History  Problem Relation Age of Onset   Breast cancer Neg Hx     Physical Examination: There were no vitals filed for this visit.  General: Patient is in no apparent distress. Attention to examination is appropriate.  Neck:   Supple.  Full range of motion.  Respiratory: Patient is breathing without any difficulty.   NEUROLOGICAL:     Awake, alert, oriented to person, place, and time.  Speech is clear and fluent.   Cranial Nerves: Pupils equal round and reactive to light.  Facial tone is symmetric.  Facial sensation is symmetric. Shoulder shrug is symmetric. Tongue protrusion is midline.  There is no pronator drift.  Strength: Side Biceps Triceps Deltoid Interossei Grip Wrist Ext. Wrist Flex.  R 5 5 5 5 5 5 5   L 5 5 5 5 5 5 5    Side Iliopsoas Quads Hamstring PF DF EHL  R 5 5 5 5 5 5   L 5 5 5 5 5 5    Reflexes are ***2+ and symmetric at the biceps, triceps, brachioradialis, patella and achilles.   Hoffman's is absent.   Bilateral upper  and lower extremity sensation is intact to light touch.    No evidence of dysmetria noted.  Gait is normal.     Medical Decision Making  Imaging: ***  I have personally reviewed the images and agree with the above interpretation.  Assessment and Plan: [name] is a pleasant 68 y.o. female with ***      Thank you for involving me in the care of this patient.      Anna [name] K. Clois MD, [facility] Neurosurgery

## 2024-03-26 ENCOUNTER — Ambulatory Visit (INDEPENDENT_AMBULATORY_CARE_PROVIDER_SITE_OTHER): Admitting: Neurosurgery

## 2024-03-26 ENCOUNTER — Encounter: Payer: Self-pay | Admitting: Neurosurgery

## 2024-03-26 VITALS — BP 128/86 | Ht 67.0 in | Wt 137.0 lb

## 2024-03-26 DIAGNOSIS — G8929 Other chronic pain: Secondary | ICD-10-CM

## 2024-03-26 DIAGNOSIS — M25512 Pain in left shoulder: Secondary | ICD-10-CM | POA: Diagnosis not present

## 2024-03-26 DIAGNOSIS — M25511 Pain in right shoulder: Secondary | ICD-10-CM

## 2024-03-26 DIAGNOSIS — M542 Cervicalgia: Secondary | ICD-10-CM | POA: Diagnosis not present

## 2024-03-30 ENCOUNTER — Other Ambulatory Visit

## 2024-03-30 ENCOUNTER — Ambulatory Visit
Admission: RE | Admit: 2024-03-30 | Discharge: 2024-03-30 | Disposition: A | Discharge status: Home / Self Care | Source: Ambulatory Visit | Attending: Obstetrics and Gynecology | Admitting: Obstetrics and Gynecology

## 2024-03-30 DIAGNOSIS — Z1231 Encounter for screening mammogram for malignant neoplasm of breast: Secondary | ICD-10-CM | POA: Insufficient documentation

## 2024-05-04 NOTE — Progress Notes (Unsigned)
 [facility] [address] [place] [address] Phone: [phone] Subjective:   [name], am serving as a scribe for Dr. Arthea Claudene.  I'm seeing this patient by the request  of:  Cleotilde Oneil FALCON, MD  CC: Right hip and neck pain  YEP:Dlagzrupcz  03/10/2024 Chronic problem, discussed icing regimen and home exercises, discussed which activities to do and which ones to avoid.  Would like to consider 1 more epidural but at the same time we will refer patient to neurosurgery to discuss what surgical intervention would look like.  Patient is having some radicular symptoms with Spurling's noted.  Increase activity slowly.  Follow-up again in 6 to 8 weeks otherwise.     Was completely better for some time but then did a significant amount of hiking in [place] .  May not been wearing the best shoes available.  Discussed icing regimen and home exercises, increase activity slowly.  Follow-up again in 6 to 8 weeks otherwise.      Update 05/07/2024 NATALLIE RAVENSCROFT is a 68 y.o. female coming in with complaint of R hip and C spine pain. Patient states went and saw neuro and has been doing PT for a month and has been helping a lot. Has done some dry needling. No new symptoms or concerns.The radiating pain has become less frequent since PT started    Last epidural in the cervical spine was September 23.  Past Medical History:  Diagnosis Date   Depression    Hx of squamous cell carcinoma of skin 04/25/2015   L chest parasternal   Osteoporosis    Past Surgical History:  Procedure Laterality Date   COLONOSCOPY  05/12/2010   Social History   Socioeconomic History   Marital status: Married    Spouse name: Not on file   Number of children: Not on file   Years of education: Not on file   Highest education level: Not on file  Occupational History   Not on file  Tobacco Use   Smoking status: Never   Smokeless tobacco: Never  Substance and Sexual  Activity   Alcohol use: Not on file   Drug use: Never   Sexual activity: Not on file  Other Topics Concern   Not on file  Social History Narrative   Not on file   Social Drivers of Health   Financial Resource Strain: Low Risk  (03/27/2024)   Received from [facility]   Overall Financial Resource Strain (CARDIA)    Difficulty of Paying Living Expenses: Not hard at all  Food Insecurity: No Food Insecurity (03/27/2024)   Received from [facility]   Hunger Vital Sign    Within the past 12 months, you worried that your food would run out before you got the money to buy more.: Never true    Within the past 12 months, the food you bought just didn't last and you didn't have money to get more.: Never true  Transportation Needs: No Transportation Needs (03/27/2024)   Received from [facility] - Transportation    In the past 12 months, has lack of transportation kept you from medical appointments or from getting medications?: No    Lack of Transportation (Non-Medical): No  Physical Activity: Not on file  Stress: Not on file  Social Connections: Not on file   Allergies  Allergen Reactions   Ciprofloxacin Rash   Family History  Problem Relation Age  of Onset   Breast cancer Neg Hx        Current Outpatient Medications (Analgesics):    meloxicam (MOBIC) 7.5 MG tablet, Take 7.5 mg by mouth daily.   Current Outpatient Medications (Other):    calcium acetate (PHOSLO) 667 MG capsule, Take by mouth 1 day or 1 dose.   Calcium Carb-Cholecalciferol (CALCIUM HIGH POTENCY/VITAMIN D ) 600-5 MG-MCG TABS, Take 1 tablet by mouth daily.   cholecalciferol (VITAMIN D3) 25 MCG (1000 UT) tablet, Take 1,000 Units by mouth daily.   citalopram (CELEXA) 20 MG tablet, Take 20 mg by mouth daily.   clobetasol ointment (TEMOVATE) 0.05 %, Apply topically.   estradiol (ESTRACE) 0.1 MG/GM vaginal cream, Insert fingertip unit amount nightly x 2 weeks,  then every other night x 2 weeks, then 2-3 times weekly for maintenance   Vitamin D , Ergocalciferol , (DRISDOL ) 1.25 MG (50000 UNIT) CAPS capsule, Take 1 capsule (50,000 Units total) by mouth every 7 (seven) days.   Reviewed prior external information including notes and imaging from  primary care provider As well as notes that were available from care everywhere and other healthcare systems.  Past medical history, social, surgical and family history all reviewed in electronic medical record.  No pertanent information unless stated regarding to the chief complaint.   Review of Systems:  No headache, visual changes, nausea, vomiting, diarrhea, constipation, dizziness, abdominal pain, skin rash, fevers, chills, night sweats, weight loss, swollen lymph nodes, body aches, joint swelling, chest pain, shortness of breath, mood changes. POSITIVE muscle aches  Objective  Blood pressure 128/80, pulse 98, height 5' 7 (1.702 m), weight 140 lb (63.5 kg), SpO2 97%.   General: No apparent distress alert and oriented x3 mood and affect normal, dressed appropriately.  HEENT: Pupils equal, extraocular movements intact  Respiratory: Patient's speak in full sentences and does not appear short of breath  Cardiovascular: No lower extremity edema, non tender, no erythema  Neck exam does have some mild loss of lordosis.  There are some mild difficulty with extension of the neck noted.   Osteopathic findings C2 flexed rotated and side bent right C4 flexed rotated and side bent left C6 flexed rotated and side bent left T3 extended rotated and side bent right inhaled third rib T9 extended rotated and side bent left   Impression and Recommendations:  Neck pain Known arthritic changes noted.  Has seen neurosurgery and has elected to try conservative therapy.  Doing well with physical therapy at the moment.  Held on any type of other medication changes at this time.  Discussed icing regimen and home exercises,  discussed which activities to do and which ones to avoid.  Increase activity slowly.  Discussed icing regimen.  Follow-up again in 6 to 12 weeks    Decision today to treat with OMT was based on Physical Exam  After verbal consent patient was treated with  ME, FPR techniques in cervical, thoracic, rib,areas, all areas are chronic   Patient tolerated the procedure well with improvement in symptoms  Patient given exercises, stretches and lifestyle modifications  See medications in patient instructions if given  Patient will follow up in 8 weeks  The above documentation has been reviewed and is accurate and complete Amarri Michaelson M Jams Trickett, DO

## 2024-05-06 ENCOUNTER — Ambulatory Visit: Admitting: Family Medicine

## 2024-05-06 ENCOUNTER — Encounter: Payer: Self-pay | Admitting: Family Medicine

## 2024-05-06 VITALS — BP 128/80 | HR 98 | Ht 67.0 in | Wt 140.0 lb

## 2024-05-06 DIAGNOSIS — M9901 Segmental and somatic dysfunction of cervical region: Secondary | ICD-10-CM

## 2024-05-06 DIAGNOSIS — M542 Cervicalgia: Secondary | ICD-10-CM

## 2024-05-06 DIAGNOSIS — M9902 Segmental and somatic dysfunction of thoracic region: Secondary | ICD-10-CM | POA: Diagnosis not present

## 2024-05-06 DIAGNOSIS — M9908 Segmental and somatic dysfunction of rib cage: Secondary | ICD-10-CM | POA: Diagnosis not present

## 2024-05-06 NOTE — Assessment & Plan Note (Signed)
 Known arthritic changes noted.  Has seen neurosurgery and has elected to try conservative therapy.  Doing well with physical therapy at the moment.  Held on any type of other medication changes at this time.  Discussed icing regimen and home exercises, discussed which activities to do and which ones to avoid.  Increase activity slowly.  Discussed icing regimen.  Follow-up again in 6 to 12 weeks

## 2024-05-06 NOTE — Patient Instructions (Signed)
 Good to see you! Glad you're making good strides See you again in 2-3 months

## 2024-05-13 ENCOUNTER — Ambulatory Visit: Payer: BC Managed Care – PPO | Admitting: Dermatology

## 2024-05-19 ENCOUNTER — Other Ambulatory Visit: Payer: Self-pay

## 2024-05-19 DIAGNOSIS — M542 Cervicalgia: Secondary | ICD-10-CM

## 2024-05-26 ENCOUNTER — Ambulatory Visit

## 2024-05-26 ENCOUNTER — Ambulatory Visit: Admitting: Neurosurgery

## 2024-05-26 ENCOUNTER — Encounter: Payer: Self-pay | Admitting: Neurosurgery

## 2024-05-26 VITALS — BP 102/60 | Wt 139.8 lb

## 2024-05-26 DIAGNOSIS — M542 Cervicalgia: Secondary | ICD-10-CM | POA: Diagnosis not present

## 2024-05-26 NOTE — Progress Notes (Signed)
 Referring Physician:  Cleotilde Oneil FALCON, MD [address] [facility] [place],  [place] [address]  Primary Physician:  Cleotilde Oneil FALCON, MD  History of Present Illness: 05/26/2024 She is doing much better with physical therapy.  Her pain is improved 66-70%.  03/26/2024 Anna [name] is here today with a chief complaint of chronic neck pain radiating to the elbow.  Neck pain has persisted for approximately three years, with radiation to the elbow. The pain is constant and worsens with head movement. There is no pain below the elbow, numbness, or tingling. Pain is more pronounced in the neck, near the skull, than in the shoulder.  She has received three injections, with the first two providing some relief. The most recent injection caused a sensation of pressure. She takes meloxicam daily and occasionally uses Tylenol, though the effectiveness of meloxicam is uncertain.  She remains active, performing daily activities with caution, and engages in regular massages and exercises as recommended by her physiatrist.  Bowel/Bladder Dysfunction: none  Conservative measures:  Physical therapy:  has not participated in  Multimodal medical therapy including regular antiinflammatories:  Tizanidine , Meloxicam Injections: 03/17/2024 left C7-T1 ESI 01/02/2024 Left C7-T1 ESI 08/14/2023 Right C7-T1 ESI  Past Surgery: none  Anna [name] has no symptoms of cervical myelopathy.  The symptoms are causing a significant impact on the patient's life.   I have utilized the care everywhere function in epic to review the outside records available from external health systems.   Review of Systems:  A 10 point review of systems is negative, except for the pertinent positives and negatives detailed in the HPI.  Past Medical History: Past Medical History:  Diagnosis Date   Depression    Hx of squamous cell carcinoma of skin 04/25/2015   L chest parasternal    Osteoporosis     Past Surgical History: Past Surgical History:  Procedure Laterality Date   COLONOSCOPY  05/12/2010    Allergies: Allergies as of 05/26/2024 - Review Complete 05/26/2024  Allergen Reaction Noted   Ciprofloxacin Rash 11/06/2017    Medications:  Current Outpatient Medications:    calcium acetate (PHOSLO) 667 MG capsule, Take by mouth 1 day or 1 dose., Disp: , Rfl:    Calcium Carb-Cholecalciferol (CALCIUM HIGH POTENCY/VITAMIN D ) 600-5 MG-MCG TABS, Take 1 tablet by mouth daily., Disp: , Rfl:    cholecalciferol (VITAMIN D3) 25 MCG (1000 UT) tablet, Take 1,000 Units by mouth daily., Disp: , Rfl:    citalopram (CELEXA) 20 MG tablet, Take 20 mg by mouth daily., Disp: , Rfl:    clobetasol ointment (TEMOVATE) 0.05 %, Apply topically., Disp: , Rfl:    estradiol (ESTRACE) 0.1 MG/GM vaginal cream, Insert fingertip unit amount nightly x 2 weeks, then every other night x 2 weeks, then 2-3 times weekly for maintenance, Disp: , Rfl:    meloxicam (MOBIC) 7.5 MG tablet, Take 7.5 mg by mouth daily., Disp: , Rfl:    Vitamin D , Ergocalciferol , (DRISDOL ) 1.25 MG (50000 UNIT) CAPS capsule, Take 1 capsule (50,000 Units total) by mouth every 7 (seven) days., Disp: 12 capsule, Rfl: 0  Social History: Social History   Tobacco Use   Smoking status: Never   Smokeless tobacco: Never  Substance Use Topics   Drug use: Never    Family Medical History: Family History  Problem Relation Age of Onset   Breast cancer Neg Hx     Physical Examination: Vitals:   05/26/24 1305  BP: 102/60  General: Patient is in no apparent distress. Attention to examination is appropriate.  Neck:   Supple.  Full range of motion.  Respiratory: Patient is breathing without any difficulty.   NEUROLOGICAL:     Awake, alert, oriented to person, place, and time.  Speech is clear and fluent.   Cranial Nerves: Pupils equal round and reactive to light.  Facial tone is symmetric.  Facial sensation is  symmetric. Shoulder shrug is symmetric. Tongue protrusion is midline.  There is no pronator drift.  Strength: Side Biceps Triceps Deltoid Interossei Grip Wrist Ext. Wrist Flex.  R 5 5 5 5 5 5 5   L 5 5 5 5 5 5 5    Side Iliopsoas Quads Hamstring PF DF EHL  R 5 5 5 5 5 5   L 5 5 5 5 5 5    Reflexes are 1+ and symmetric at the biceps, triceps, brachioradialis, patella and achilles.   Hoffman's is absent.   Bilateral upper and lower extremity sensation is intact to light touch.    No evidence of dysmetria noted.  Gait is normal.    Mild tenderness to palpation in her bilateral shoulders.  Mild to moderate pain with movement of her neck and with movement of her shoulders.  Medical Decision Making  Imaging: MR C spine 07/27/2023 IMPRESSION: 1. Lobulated central to left paracentral disc protrusion at C4-5 with resultant mild flattening of the ventral cord. No cord signal changes or significant spinal stenosis. 2. Right worse than left uncovertebral spurring at C4-5 and C5-6 with resultant mild to moderate right C5 and mild right C6 foraminal stenosis. 3. Degenerative disc osteophyte complex at C6-7 with resultant moderate left C7 foraminal stenosis.     Electronically Signed   By: Morene Hoard M.D.   On: 08/01/2023 04:20  I have personally reviewed the images and agree with the above interpretation.  Assessment and Plan: Anna [name] is a pleasant 68 y.o. female with cervicalgia and bilateral show her pain.  She has been having neck pain for some time.  It is near her skull base.  She has pain with rotation.  This may be due to pain or arthritic changes around the C1-2 joint.  PT has helped her significantly.  She will continue exercises for now.  We will see her back as needed.   I spent a total of 10 minutes in this patient's care today. This time was spent reviewing pertinent records including imaging studies, obtaining and confirming history, performing a directed  evaluation, formulating and discussing my recommendations, and documenting the visit within the medical record.      Thank you for involving me in the care of this patient.      Amarri Satterly K. Clois MD, [facility] Neurosurgery

## 2024-07-02 NOTE — Progress Notes (Unsigned)
" °  [name] [address] [place] [address] Phone: [phone] Subjective:   Anna [name], am serving as a scribe for Dr. Arthea [name].  I'm seeing this patient by the request  of:  Anna Oneil FALCON, MD  CC: Back and neck pain follow-up  YEP:Dlagzrupcz  Anna [name] is a 69 y.o. female coming in with complaint of back and neck pain. OMT 05/06/2024. Met with neurosurgery since last visits.  Patient states has been doing PT. L arm flare during the holidays.  Medications patient has been prescribed: None          Reviewed prior external information including notes and imaging from previsou exam, outside providers and external EMR if available.   As well as notes that were available from care everywhere and other healthcare systems.  Past medical history, social, surgical and family history all reviewed in electronic medical record.  No pertanent information unless stated regarding to the chief complaint.   Past Medical History:  Diagnosis Date   Depression    Hx of squamous cell carcinoma of skin 04/25/2015   L chest parasternal   Osteoporosis     Allergies[1]   Review of Systems:  No headache, visual changes, nausea, vomiting, diarrhea, constipation, dizziness, abdominal pain, skin rash, fevers, chills, night sweats, weight loss, swollen lymph nodes, body aches, joint swelling, chest pain, shortness of breath, mood changes. POSITIVE muscle aches  Objective  Blood pressure 118/78, height 5' 7 (1.702 m), weight 139 lb (63 kg), SpO2 97%.   General: No apparent distress alert and oriented x3 mood and affect normal, dressed appropriately.  HEENT: Pupils equal, extraocular movements intact  Respiratory: Patient's speak in full sentences and does not appear short of breath  Cardiovascular: No lower extremity edema, non tender, no erythema  Gait MSK:  Back does have some mild loss lordosis.  Neck exam has significant decrease in  range of motion noted.  Seems to be tighter in the paraspinal musculature right greater than left.  Osteopathic findings  C2 flexed rotated and side bent right C3 flexed rotated and side bent right T4 extended rotated and side bent left L2 flexed rotated and side bent right Sacrum right on right    Assessment and Plan:  Neck pain Chronic problem with exacerbation.  Has seen neurosurgery in May.  Discussed doing conservative therapy still.  Discussed icing regimen and home exercises, increase activity slowly.  Follow-up again in 6 to 8 weeks.    Nonallopathic problems  Decision today to treat with OMT was based on Physical Exam  After verbal consent patient was treated with  ME, FPR techniques in cervical,  thoracic, lumbar, and sacral  areas avoided any HVLA on the cervical spine  Patient tolerated the procedure well with improvement in symptoms  Patient given exercises, stretches and lifestyle modifications  See medications in patient instructions if given  Patient will follow up in 4-8 weeks    The above documentation has been reviewed and is accurate and complete Anna [name] M Anna Neuzil, DO          Note: This dictation was prepared with Dragon dictation along with smaller phrase technology. Any transcriptional errors that result from this process are unintentional.            [1]  Allergies Allergen Reactions   Ciprofloxacin Rash   "

## 2024-07-06 ENCOUNTER — Ambulatory Visit: Admitting: Family Medicine

## 2024-07-06 ENCOUNTER — Encounter: Payer: Self-pay | Admitting: Family Medicine

## 2024-07-06 VITALS — BP 118/78 | Ht 67.0 in | Wt 139.0 lb

## 2024-07-06 DIAGNOSIS — M9904 Segmental and somatic dysfunction of sacral region: Secondary | ICD-10-CM | POA: Diagnosis not present

## 2024-07-06 DIAGNOSIS — M9902 Segmental and somatic dysfunction of thoracic region: Secondary | ICD-10-CM

## 2024-07-06 DIAGNOSIS — M542 Cervicalgia: Secondary | ICD-10-CM | POA: Diagnosis not present

## 2024-07-06 DIAGNOSIS — M9901 Segmental and somatic dysfunction of cervical region: Secondary | ICD-10-CM | POA: Diagnosis not present

## 2024-07-06 DIAGNOSIS — M9903 Segmental and somatic dysfunction of lumbar region: Secondary | ICD-10-CM

## 2024-07-06 NOTE — Patient Instructions (Signed)
 Do prescribed exercises at least 3x a week Neck is doing well See you again in 7-8 weeks

## 2024-07-06 NOTE — Assessment & Plan Note (Signed)
 Chronic problem with exacerbation.  Has seen neurosurgery in May.  Discussed doing conservative therapy still.  Discussed icing regimen and home exercises, increase activity slowly.  Follow-up again in 6 to 8 weeks.

## 2024-08-26 ENCOUNTER — Ambulatory Visit: Admitting: Family Medicine

## 2025-01-14 ENCOUNTER — Ambulatory Visit: Admitting: Dermatology
